# Patient Record
Sex: Male | Born: 1980 | Race: Black or African American | Hispanic: No | Marital: Single | Smoking: Former smoker
Health system: Southern US, Community
[De-identification: ages and names within clinical notes are randomized; demographics above are authoritative.]

## PROBLEM LIST (undated history)

## (undated) DIAGNOSIS — I10 Essential (primary) hypertension: Secondary | ICD-10-CM

## (undated) DIAGNOSIS — E785 Hyperlipidemia, unspecified: Secondary | ICD-10-CM

## (undated) DIAGNOSIS — I309 Acute pericarditis, unspecified: Secondary | ICD-10-CM

## (undated) DIAGNOSIS — M549 Dorsalgia, unspecified: Secondary | ICD-10-CM

---

## 2013-08-01 ENCOUNTER — Emergency Department (HOSPITAL_COMMUNITY): Payer: Self-pay

## 2013-08-01 ENCOUNTER — Encounter (HOSPITAL_COMMUNITY): Payer: Self-pay | Admitting: Emergency Medicine

## 2013-08-01 DIAGNOSIS — Z792 Long term (current) use of antibiotics: Secondary | ICD-10-CM | POA: Insufficient documentation

## 2013-08-01 DIAGNOSIS — I10 Essential (primary) hypertension: Secondary | ICD-10-CM | POA: Insufficient documentation

## 2013-08-01 DIAGNOSIS — R0789 Other chest pain: Secondary | ICD-10-CM | POA: Insufficient documentation

## 2013-08-01 DIAGNOSIS — Z79899 Other long term (current) drug therapy: Secondary | ICD-10-CM | POA: Insufficient documentation

## 2013-08-01 LAB — CBC
HEMATOCRIT: 38.4 % — AB (ref 39.0–52.0)
HEMOGLOBIN: 13.3 g/dL (ref 13.0–17.0)
MCH: 30.7 pg (ref 26.0–34.0)
MCHC: 34.6 g/dL (ref 30.0–36.0)
MCV: 88.7 fL (ref 78.0–100.0)
Platelets: 335 10*3/uL (ref 150–400)
RBC: 4.33 MIL/uL (ref 4.22–5.81)
RDW: 11.7 % (ref 11.5–15.5)
WBC: 11.9 10*3/uL — AB (ref 4.0–10.5)

## 2013-08-01 NOTE — ED Notes (Signed)
Pt. reports intermittent right chest pain for 5 days , denies SOB , no nausea or diaphoresis .

## 2013-08-02 ENCOUNTER — Emergency Department (HOSPITAL_COMMUNITY)
Admission: EM | Admit: 2013-08-02 | Discharge: 2013-08-02 | Disposition: A | Discharge status: Home / Self Care | Payer: Self-pay | Attending: Emergency Medicine | Admitting: Emergency Medicine

## 2013-08-02 DIAGNOSIS — I10 Essential (primary) hypertension: Secondary | ICD-10-CM

## 2013-08-02 DIAGNOSIS — R0789 Other chest pain: Secondary | ICD-10-CM

## 2013-08-02 LAB — BASIC METABOLIC PANEL
BUN: 17 mg/dL (ref 6–23)
CO2: 27 mEq/L (ref 19–32)
Calcium: 10.5 mg/dL (ref 8.4–10.5)
Chloride: 100 mEq/L (ref 96–112)
Creatinine, Ser: 1.18 mg/dL (ref 0.50–1.35)
GFR calc non Af Amer: 80 mL/min — ABNORMAL LOW (ref >90)
Glucose, Bld: 95 mg/dL (ref 70–99)
POTASSIUM: 3.6 meq/L — AB (ref 3.7–5.3)
Sodium: 140 mEq/L (ref 137–147)

## 2013-08-02 LAB — D-DIMER, QUANTITATIVE (NOT AT ARMC)

## 2013-08-02 LAB — TROPONIN I: Troponin I: <0.3 ng/mL (ref <0.30)

## 2013-08-02 MED ORDER — AMLODIPINE BESYLATE 5 MG PO TABS
5.0000 mg | ORAL_TABLET | Freq: Once | ORAL | Status: AC
  Administered 2013-08-02: 5 mg via ORAL
  Filled 2013-08-02: qty 1

## 2013-08-02 MED ORDER — AMLODIPINE BESYLATE 5 MG PO TABS: 5.0000 mg | ORAL_TABLET | Freq: Every day | ORAL | Status: DC

## 2013-08-02 NOTE — ED Provider Notes (Signed)
CSN: [id number]     Arrival date & time 08/01/13  [id number]   First MD Initiated Contact with Patient 08/02/13 0355     Chief Complaint  Patient presents with  . Chest Pain     (Consider location/radiation/quality/duration/timing/severity/associated sxs/prior Treatment) HPI 33 yo male presents to the ER from home with complaint of chest pain.  Pain is located in right chest, just under his breast.  Pain was sharp stabbing pain 5 days ago, followed by substernal pressure x 1 day, and since that time has been a ill defined discomfort or fullness.  Pt was seen at outside hospital and dx with sinusitis, placed on amox and ultram.  Pt reports h/o htn, had been on enalapril but stopped due to ED issues.  Family history of HTN, no h/o of CAD.  No prolonged immobilization, no leg swelling or pain, no recent surgeries, no h/o PE/DVT.  No other associated sxs with the pain.    History reviewed. No pertinent past medical history. History reviewed. No pertinent past surgical history. No family history on file. History  Substance Use Topics  . Smoking status: Never Smoker   . Smokeless tobacco: Not on file  . Alcohol Use: Yes    Review of Systems  All other systems reviewed and are negative.     Allergies  Review of patient's allergies indicates no known allergies.  Home Medications   Prior to Admission medications   Medication Sig Start Date End Date Taking? Authorizing Provider  amoxicillin (AMOXIL) 500 MG capsule Take 500 mg by mouth 3 (three) times daily. X 10days   Yes Historical Provider, MD  calcium carbonate (TUMS - DOSED IN MG ELEMENTAL CALCIUM) 500 MG chewable tablet Chew 2 tablets by mouth daily as needed for indigestion or heartburn.   Yes Historical Provider, MD  ibuprofen (ADVIL,MOTRIN) 800 MG tablet Take 800 mg by mouth every 8 (eight) hours as needed for mild pain.   Yes Historical Provider, MD  naproxen sodium (ANAPROX) 220 MG tablet Take 440 mg by mouth daily as needed  (headache).   Yes Historical Provider, MD  Phenylephrine-Pheniramine-DM [facility](THERAFLU COLD & COUGH PO) Take 1 packet by mouth daily as needed.   Yes Historical Provider, MD  traMADol (ULTRAM) 50 MG tablet Take 50 mg by mouth every 6 (six) hours as needed for moderate pain.   Yes Historical Provider, MD  amLODipine (NORVASC) 5 MG tablet Take 1 tablet (5 mg total) by mouth daily. 08/02/13   Olivia Mackielga M Latash Nouri, MD   BP 120/91  Pulse 70  Temp(Src) 98.5 F (36.9 C) (Oral)  Resp 22  Ht 6\' 2"  (1.88 m)  Wt 215 lb (97.523 kg)  BMI 27.59 kg/m2  SpO2 100% Physical Exam  Nursing note and vitals reviewed. Constitutional: He is oriented to person, place, and time. He appears well-developed and well-nourished.  HENT:  Head: Normocephalic and atraumatic.  Right Ear: External ear normal.  Left Ear: External ear normal.  Nose: Nose normal.  Mouth/Throat: Oropharynx is clear and moist.  Eyes: Conjunctivae and EOM are normal. Pupils are equal, round, and reactive to light.  Neck: Normal range of motion. Neck supple. No JVD present. No tracheal deviation present. No thyromegaly present.  Cardiovascular: Normal rate, regular rhythm, normal heart sounds and intact distal pulses.  Exam reveals no gallop and no friction rub.   No murmur heard. Pulmonary/Chest: Effort normal and breath sounds normal. No stridor. No respiratory distress. He has no wheezes. He has no rales. He exhibits  no tenderness.  Abdominal: Soft. Bowel sounds are normal. He exhibits no distension and no mass. There is no tenderness. There is no rebound and no guarding.  Musculoskeletal: Normal range of motion. He exhibits no edema and no tenderness.  Lymphadenopathy:    He has no cervical adenopathy.  Neurological: He is alert and oriented to person, place, and time. He has normal reflexes. No cranial nerve deficit. He exhibits normal muscle tone. Coordination normal.  Skin: Skin is warm and dry. No rash noted. No erythema. No pallor.  Psychiatric:  He has a normal mood and affect. His behavior is normal. Judgment and thought content normal.    ED Course  Procedures (including critical care time) Labs Review Labs Reviewed  BASIC METABOLIC PANEL - Abnormal; Notable for the following:    Potassium 3.6 (*)    GFR calc non Af Amer 80 (*)    All other components within normal limits  CBC - Abnormal; Notable for the following:    WBC 11.9 (*)    HCT 38.4 (*)    All other components within normal limits  TROPONIN I  D-DIMER, QUANTITATIVE    Imaging Review Dg Chest 2 View  08/02/2013   CLINICAL DATA:  Right chest pain.  Shortness of breath.  EXAM: CHEST  2 VIEW  COMPARISON:  None.  FINDINGS: Upper normal heart size. Lungs appear clear. No pleural effusion. No significant osseous abnormality observed.  IMPRESSION: No active cardiopulmonary disease.   Electronically Signed   By: Herbie BaltimoreWalt  Liebkemann M.D.   On: 08/02/2013 00:49     EKG Interpretation   Date/Time:  Thursday August 01 2013 23:38:14 EDT Ventricular Rate:  59 PR Interval:  176 QRS Duration: 104 QT Interval:  386 QTC Calculation: 382 R Axis:   77 Text Interpretation:  Sinus bradycardia with sinus arrhythmia Possible  Left atrial enlargement Left ventricular hypertrophy Abnormal ECG No old  tracing to compare Confirmed by Lavone Barrientes  MD, Hridaan Bouse ([id number]) on 08/02/2013  3:56:31 AM      MDM   Final diagnoses:  Essential hypertension  Atypical chest pain    33 yo male with 5 days of atypical chest pain.  Labs, cxr normal.  EKG with LVH noted, HTN here in ED.  Pt wishing to f/u with pcm, will start on norvasc for bp control.      Olivia Mackielga M Esley Brooking, MD 08/02/13 [phone]

## 2013-08-02 NOTE — ED Notes (Signed)
Pt concerned about his blood pressure.

## 2013-08-02 NOTE — Discharge Instructions (Signed)
Chest Pain (Nonspecific) It is often hard to give a diagnosis for the cause of chest pain. There is always a chance that your pain could be related to something serious, such as a heart attack or a blood clot in the lungs. You need to follow up with your doctor. HOME CARE  If antibiotic medicine was given, take it as directed by your doctor. Finish the medicine even if you start to feel better.  For the next few days, avoid activities that bring on chest pain. Continue physical activities as told by your doctor.  Do not use any tobacco products. This includes cigarettes, chewing tobacco, and e-cigarettes.  Avoid drinking alcohol.  Only take medicine as told by your doctor.  Follow your doctor's suggestions for more testing if your chest pain does not go away.  Keep all doctor visits you made. GET HELP IF:  Your chest pain does not go away, even after treatment.  You have a rash with blisters on your chest.  You have a fever. GET HELP RIGHT AWAY IF:   You have more pain or pain that spreads to your arm, neck, jaw, back, or belly (abdomen).  You have shortness of breath.  You cough more than usual or cough up blood.  You have very bad back or belly pain.  You feel sick to your stomach (nauseous) or throw up (vomit).  You have very bad weakness.  You pass out (faint).  You have chills. This is an emergency. Do not wait to see if the problems will go away. Call your local emergency services (911 in U.S.). Do not drive yourself to the hospital. MAKE SURE YOU:   Understand these instructions.  Will watch your condition.  Will get help right away if you are not doing well or get worse. Document Released: 07/13/2007 Document Revised: 01/29/2013 Document Reviewed: 07/13/2007 [facility] Patient Information 2015 [place], [place]. This information is not intended to replace advice given to you by your health care provider. Make sure you discuss any questions you have with your  health care provider.  DASH Eating Plan DASH stands for "Dietary Approaches to Stop Hypertension." The DASH eating plan is a healthy eating plan that has been shown to reduce high blood pressure (hypertension). Additional health benefits may include reducing the risk of type 2 diabetes mellitus, heart disease, and stroke. The DASH eating plan may also help with weight loss. WHAT DO I NEED TO KNOW ABOUT THE DASH EATING PLAN? For the DASH eating plan, you will follow these general guidelines:  Choose foods with a percent daily value for sodium of less than 5% (as listed on the food label).  Use salt-free seasonings or herbs instead of table salt or sea salt.  Check with your health care provider or pharmacist before using salt substitutes.  Eat lower-sodium products, often labeled as "lower sodium" or "no salt added."  Eat fresh foods.  Eat more vegetables, fruits, and low-fat dairy products.  Choose whole grains. Look for the word "whole" as the first word in the ingredient list.  Choose fish and skinless chicken or [place] more often than red meat. Limit fish, poultry, and meat to 6 oz (170 g) each day.  Limit sweets, desserts, sugars, and sugary drinks.  Choose heart-healthy fats.  Limit cheese to 1 oz (28 g) per day.  Eat more home-cooked food and less restaurant, buffet, and fast food.  Limit fried foods.  Cook foods using methods other than frying.  Limit canned vegetables. If  you do use them, rinse them well to decrease the sodium.  When eating at a restaurant, ask that your food be prepared with less salt, or no salt if possible. WHAT FOODS CAN I EAT? Seek help from a dietitian for individual calorie needs. Grains Whole grain or whole wheat bread. Brown rice. Whole grain or whole wheat pasta. Quinoa, bulgur, and whole grain cereals. Low-sodium cereals. Corn or whole wheat flour tortillas. Whole grain cornbread. Whole grain crackers. Low-sodium  crackers. Vegetables Fresh or frozen vegetables (raw, steamed, roasted, or grilled). Low-sodium or reduced-sodium tomato and vegetable juices. Low-sodium or reduced-sodium tomato sauce and paste. Low-sodium or reduced-sodium canned vegetables.  Fruits All fresh, canned (in natural juice), or frozen fruits. Meat and Other Protein Products Ground beef (85% or leaner), grass-fed beef, or beef trimmed of fat. Skinless chicken or [place]. Ground chicken or [place]. Pork trimmed of fat. All fish and seafood. Eggs. Dried beans, peas, or lentils. Unsalted nuts and seeds. Unsalted canned beans. Dairy Low-fat dairy products, such as skim or 1% milk, 2% or reduced-fat cheeses, low-fat ricotta or cottage cheese, or plain low-fat yogurt. Low-sodium or reduced-sodium cheeses. Fats and Oils Tub margarines without trans fats. Light or reduced-fat mayonnaise and salad dressings (reduced sodium). Avocado. Safflower, olive, or canola oils. Natural peanut or almond butter. Other Unsalted popcorn and pretzels. The items listed above may not be a complete list of recommended foods or beverages. Contact your dietitian for more options. WHAT FOODS ARE NOT RECOMMENDED? Grains White bread. White pasta. White rice. Refined cornbread. Bagels and croissants. Crackers that contain trans fat. Vegetables Creamed or fried vegetables. Vegetables in a cheese sauce. Regular canned vegetables. Regular canned tomato sauce and paste. Regular tomato and vegetable juices. Fruits Dried fruits. Canned fruit in light or heavy syrup. Fruit juice. Meat and Other Protein Products Fatty cuts of meat. Ribs, chicken wings, bacon, sausage, bologna, salami, chitterlings, fatback, hot dogs, bratwurst, and packaged luncheon meats. Salted nuts and seeds. Canned beans with salt. Dairy Whole or 2% milk, cream, half-and-half, and cream cheese. Whole-fat or sweetened yogurt. Full-fat cheeses or blue cheese. Nondairy creamers and whipped toppings.  Processed cheese, cheese spreads, or cheese curds. Condiments Onion and garlic salt, seasoned salt, table salt, and sea salt. Canned and packaged gravies. Worcestershire sauce. Tartar sauce. Barbecue sauce. Teriyaki sauce. Soy sauce, including reduced sodium. Steak sauce. Fish sauce. Oyster sauce. Cocktail sauce. Horseradish. Ketchup and mustard. Meat flavorings and tenderizers. Bouillon cubes. Hot sauce. Tabasco sauce. Marinades. Taco seasonings. Relishes. Fats and Oils Butter, stick margarine, lard, shortening, ghee, and bacon fat. Coconut, palm kernel, or palm oils. Regular salad dressings. Other Pickles and olives. Salted popcorn and pretzels. The items listed above may not be a complete list of foods and beverages to avoid. Contact your dietitian for more information. WHERE CAN I FIND MORE INFORMATION? National Heart, Lung, and Blood Institute: [email or web address] Document Released: 01/13/2011 Document Revised: 01/29/2013 Document Reviewed: 11/28/2012 [facility] Patient Information 2015 [place], [place]. This information is not intended to replace advice given to you by your health care provider. Make sure you discuss any questions you have with your health care provider.  Hypertension Hypertension, commonly called high blood pressure, is when the force of blood pumping through your arteries is too strong. Your arteries are the blood vessels that carry blood from your heart throughout your body. A blood pressure reading consists of a higher number over a lower number, such as 110/72. The higher number (systolic) is the pressure inside your arteries when your  heart pumps. The lower number (diastolic) is the pressure inside your arteries when your heart relaxes. Ideally you want your blood pressure below 120/80. Hypertension forces your heart to work harder to pump blood. Your arteries may become narrow or stiff. Having hypertension puts you at risk for heart disease,  stroke, and other problems.  RISK FACTORS Some risk factors for high blood pressure are controllable. Others are not.  Risk factors you cannot control include:   Race. You may be at higher risk if you are African American.  Age. Risk increases with age.  Gender. Men are at higher risk than women before age 34 years. After age 765, women are at higher risk than men. Risk factors you can control include:  Not getting enough exercise or physical activity.  Being overweight.  Getting too much fat, sugar, calories, or salt in your diet.  Drinking too much alcohol. SIGNS AND SYMPTOMS Hypertension does not usually cause signs or symptoms. Extremely high blood pressure (hypertensive crisis) may cause headache, anxiety, shortness of breath, and nosebleed. DIAGNOSIS  To check if you have hypertension, your health care provider will measure your blood pressure while you are seated, with your arm held at the level of your heart. It should be measured at least twice using the same arm. Certain conditions can cause a difference in blood pressure between your right and left arms. A blood pressure reading that is higher than normal on one occasion does not mean that you need treatment. If one blood pressure reading is high, ask your health care provider about having it checked again. TREATMENT  Treating high blood pressure includes making lifestyle changes and possibly taking medication. Living a healthy lifestyle can help lower high blood pressure. You may need to change some of your habits. Lifestyle changes may include:  Following the DASH diet. This diet is high in fruits, vegetables, and whole grains. It is low in salt, red meat, and added sugars.  Getting at least 2 1/2 hours of brisk physical activity every week.  Losing weight if necessary.  Not smoking.  Limiting alcoholic beverages.  Learning ways to reduce stress. If lifestyle changes are not enough to get your blood pressure under  control, your health care provider may prescribe medicine. You may need to take more than one. Work closely with your health care provider to understand the risks and benefits. HOME CARE INSTRUCTIONS  Have your blood pressure rechecked as directed by your health care provider.   Only take medicine as directed by your health care provider. Follow the directions carefully. Blood pressure medicines must be taken as prescribed. The medicine does not work as well when you skip doses. Skipping doses also puts you at risk for problems.   Do not smoke.   Monitor your blood pressure at home as directed by your health care provider. SEEK MEDICAL CARE IF:   You think you are having a reaction to medicines taken.  You have recurrent headaches or feel dizzy.  You have swelling in your ankles.  You have trouble with your vision. SEEK IMMEDIATE MEDICAL CARE IF:  You develop a severe headache or confusion.  You have unusual weakness, numbness, or feel faint.  You have severe chest or abdominal pain.  You vomit repeatedly.  You have trouble breathing. MAKE SURE YOU:   Understand these instructions.  Will watch your condition.  Will get help right away if you are not doing well or get worse. Document Released: 01/24/2005 Document Revised: 01/29/2013 Document  Reviewed: 11/16/2012 ExitCare Patient Information 2015 [place], [place]. This information is not intended to replace advice given to you by your health care provider. Make sure you discuss any questions you have with your health care provider.   Emergency Department Resource Guide 1) Find a Doctor and Pay Out of Pocket Although you won't have to find out who is covered by your insurance plan, it is a good idea to ask around and get recommendations. You will then need to call the office and see if the doctor you have chosen will accept you as a new patient and what types of options they offer for patients who are self-pay. Some doctors  offer discounts or will set up payment plans for their patients who do not have insurance, but you will need to ask so you aren't surprised when you get to your appointment.  2) Contact Your Local Health Department Not all health departments have doctors that can see patients for sick visits, but many do, so it is worth a call to see if yours does. If you don't know where your local health department is, you can check in your phone book. The CDC also has a tool to help you locate your state's health department, and many state websites also have listings of all of their local health departments.  3) Find a Walk-in Clinic If your illness is not likely to be very severe or complicated, you may want to try a walk in clinic. These are popping up all over the country in pharmacies, drugstores, and shopping centers. They're usually staffed by nurse practitioners or physician assistants that have been trained to treat common illnesses and complaints. They're usually fairly quick and inexpensive. However, if you have serious medical issues or chronic medical problems, these are probably not your best option.  No Primary Care Doctor: - Call Health Connect at  [phone] - they can help you locate a primary care doctor that  accepts your insurance, provides certain services, etc. - Physician Referral Service- [phone]  Chronic Pain Problems: Organization         Address  Phone   Notes  [facility]  [phone] Patients need to be referred by their primary care doctor.   Medication Assistance: Organization         Address  Phone   Notes  [facility] [address] [place], [place] [address] [phone] --Must be a resident of [place] -- Must have NO insurance coverage whatsoever (no Medicaid/ Medicare, etc.) -- The pt. MUST have a primary care doctor that directs their care regularly and follows them in the community    MedAssist  [phone]   [employer]  [phone]    Agencies that provide inexpensive medical care: Organization         Address  Phone   Notes  [facility]  [phone]   [facility]    [phone]   [facility] [address] [place], [place] [address] [phone]   [facility] [address], [place] [phone]   [facility]anned Parenthood    [phone]   [facility]    [phone]   [facility]  [address], [place] Phone:  [phone], Fax:  [phone] Hours of Operation:  9 am - 6 pm, M-F.  Also accepts Medicaid/Medicare and self-pay.  [facility]  [address], [place] Phone: [phone], Fax: [phone]. Hours of Operation:  8:30 am - 5:30 pm, M-F.  Also accepts Medicaid and self-pay.  [facility] [place] [address] [place] Phone: [phone]   [facility] [address] Natasha Bence [place], [place] [phone], Ext. [phone] Mondays & Thursdays: 7-9 AM.  First 15 patients are seen on a first come, first serve basis.    Medicaid-accepting [facility] Providers:  Organization         Address  Phone   Notes  [facility] [address],  [phone] Also accepts self-pay patients.  [facility] [address] Laurell Josephs [place], [place]  [phone]   [facility] [address], [place] [phone]   [facility] [address] [place] [phone]   Renaye Rakers [address], [place]   [phone] Only accepts [place] Access [place] patients after they have their name applied to their card.   Self-Pay (no insurance) in [facility]:  Organization         Address  Phone   Notes  Sickle Cell Patients, [facility]  Medicine [address] [place], [place] [phone]   [facility] [address] [place], [place] [phone]   [name] [place]  [address], [place] [phone]   [facility]/Dr. Osei-Bonsu  [address], [place] or [address], [place] [phone] Phone number for both [place] and [place] locations is the same.  [facility] [address], [place] [phone]   [facility] [address], [place] or [address] [phone]   [facility] [address] [place] [phone], phone; [phone], fax Sees patients 1st and 3rd Saturday of every month.  Must not qualify for public or private insurance (i.e. Medicaid, Medicare, [place] Health Choice, Veterans' Benefits)  Household income should be no more than 200% of the poverty level The clinic cannot treat you if you are pregnant or think you are pregnant  Sexually transmitted diseases are not treated at the clinic.    Dental Care: Organization         Address  Phone  Notes  [facility] Department of [facility] [address] [place], [place] [phone] Accepts children up to age 47 who are enrolled in [place] or [place] Health Choice; pregnant women with a Medicaid card; and children who have applied for Medicaid or [place] Health Choice, but were declined, whose parents can pay a reduced fee at time of service.  [facility] Department of [facility]  [address], [place] [phone] Accepts children up to age 32 who are enrolled in [place] or [place] Health Choice; pregnant women with a Medicaid card; and children who have applied for Medicaid or [place] Health Choice, but were declined, whose parents can pay a reduced fee at time of service.  [facility]  [address]  [place], [place] [phone] Patients are seen by appointment only. Walk-ins are not accepted. [facility] will see patients 34 years of age and older. Monday - Tuesday (8am-5pm) Most Wednesdays (8:30-5pm) $30 per  visit, cash only  [facility] Adult [employer] PROGRAM  [address], [facility] [phone] Patients are seen by appointment only. Walk-ins are not accepted. [facility] will see patients 68 years of age and older. One Wednesday Evening (Monthly: Volunteer Based).  $30 per visit, cash only  [employer]  [phone] for adults; Children under age 35, call Graduate Pediatric Dentistry at [phone]. Children aged 22-14, please call [phone] to request a pediatric application.  Dental services are provided in all areas of dental care including fillings, crowns and bridges, complete and partial dentures, implants, gum treatment, root canals, and extractions. Preventive care is also provided. Treatment is provided to both adults and children. Patients are selected via a lottery and there is often a waiting list.   [facility] [address], [place]  [phone] [email or web address]   [facility] [address] [place], [place] [phone], Ext. [phone] Second and Fourth Thursday of each month, opens at 6:30 AM; Clinic ends at 9 AM.  Patients are seen on a first-come first-served basis, and a limited number are seen during each clinic.   [facility]  [address] Ether Griffins [place], [place] [phone]   Eligibility Requirements You must have lived in [place], [place], or [place] counties for at least the last three months.   You cannot be eligible for state or federal sponsored [place], including [employer], [place], or [employer].   You generally cannot be eligible for healthcare insurance through your employer.    How to apply: Eligibility screenings are held every Tuesday and  Wednesday afternoon from 1:00 pm until 4:00 pm. You do not need an appointment for the interview!  [facility] [address], [place], [place] [phone]   [facility] Health Department  [phone]   [facility] Health Department  [phone]   [facility] Health Department  [phone]    Behavioral Health Resources in the Community: Intensive Outpatient Programs Organization         Address  Phone  Notes  [facility] [address], [place], [place] [phone]   [facility] Outpatient [address] [place], [place] [phone]   [facility] [address] [place], [place]  [phone]   [facility] Mental Health [address],  [place], [place] [phone] or [phone]   Substance Abuse Resources Organization         Address  Phone  Notes  [facility]  [phone]   [facility]  [phone]   [facility]  [phone]   Floydene Flock  [phone]   Residential & Outpatient Substance Abuse Program  [phone]   Psychological Services Organization         Address  Phone  Notes  [facility]  [phone]   [facility] Services  [phone]   [facility] Mental Health [address], [place] [phone] or [phone]    Mobile Crisis Teams Organization         Address  Phone  Notes  [facility], Mobile Crisis Care Unit  [phone]   [facility]  [address] [place], [place] [phone]   Doristine Locks [address] [place] [phone]    Self-Help/Support Groups Organization         Address  Phone  Notes  [facility] [place] - variety of support groups  [phone]- [phone] Call for more information  Narcotics Anonymous (NA), [facility] [address], [employer] [place]  2 meetings at this location   Risk manager         Address  Phone  Notes  [facility] [address],    [place]  [phone]   [facility]  [address] [place] [address], [place], [place] [phone]   [facility] [address] [place], [place] [phone] Admissions: 8am-3pm M-F  [facility] [address],    [place], [place] [phone]   [facility] [address] [place], [place], [place] [phone]   [facility] [address]  [place], [place] [phone]   [facility] - Intensive Outpatient [address] Laurell Josephs [address], [place], [place] [phone]   [facility] ([facility] [address]  [place], [place] [phone] or [phone]   [facility] ([facility]) [address] [place], [place] [phone] Accepts Medicaid  [facility] [address],  [place] [phone] Substance Abuse/Addiction Treatment   [facility] Organization         Address  Phone  Notes  [facility]  [phone]   Angie Fava, PhD [address] Ervin Knack [place], [place]   [phone] or [phone]   [facility] Behavioral   [address] [place], [place] [phone]   [facility] [address] [place], [place] [phone] Insurance/Medicaid/sponsorship through [facility] and Families [address], [address] [address]                                    [place], [place] [phone] Therapy/tele-psych/case  [facility] [address], [place] [phone]    Dr. Lolly Mustache  [phone]   [facility]  [employer] [facility] 1) [address] [place] 2) [address] [place] 3)  [address], [place] [phone]  [phone]   [facility] Child Abuse Hotline [phone] or [phone] (After Hours)

## 2013-08-02 NOTE — ED Notes (Signed)
Discharge instructions reviewed with pt. Pt verbalized understanding.   

## 2013-08-05 ENCOUNTER — Ambulatory Visit: Payer: Self-pay

## 2013-08-06 ENCOUNTER — Ambulatory Visit: Payer: Self-pay | Admitting: Internal Medicine

## 2013-08-13 ENCOUNTER — Emergency Department (HOSPITAL_COMMUNITY)
Admission: EM | Admit: 2013-08-13 | Discharge: 2013-08-13 | Disposition: A | Discharge status: Home / Self Care | Payer: Self-pay | Attending: Emergency Medicine | Admitting: Emergency Medicine

## 2013-08-13 ENCOUNTER — Encounter (HOSPITAL_COMMUNITY): Payer: Self-pay | Admitting: Emergency Medicine

## 2013-08-13 DIAGNOSIS — M5416 Radiculopathy, lumbar region: Secondary | ICD-10-CM

## 2013-08-13 DIAGNOSIS — Z792 Long term (current) use of antibiotics: Secondary | ICD-10-CM | POA: Insufficient documentation

## 2013-08-13 DIAGNOSIS — I10 Essential (primary) hypertension: Secondary | ICD-10-CM | POA: Insufficient documentation

## 2013-08-13 DIAGNOSIS — IMO0002 Reserved for concepts with insufficient information to code with codable children: Secondary | ICD-10-CM | POA: Insufficient documentation

## 2013-08-13 DIAGNOSIS — Z79899 Other long term (current) drug therapy: Secondary | ICD-10-CM | POA: Insufficient documentation

## 2013-08-13 HISTORY — DX: Essential (primary) hypertension: I10

## 2013-08-13 HISTORY — DX: Dorsalgia, unspecified: M54.9

## 2013-08-13 MED ORDER — CYCLOBENZAPRINE HCL 10 MG PO TABS: 10.0000 mg | ORAL_TABLET | Freq: Two times a day (BID) | ORAL | Status: DC | PRN

## 2013-08-13 MED ORDER — HYDROCODONE-ACETAMINOPHEN 5-325 MG PO TABS
1.0000 | ORAL_TABLET | Freq: Once | ORAL | Status: AC
  Administered 2013-08-13: 1 via ORAL
  Filled 2013-08-13: qty 1

## 2013-08-13 MED ORDER — HYDROCODONE-ACETAMINOPHEN 5-325 MG PO TABS: ORAL_TABLET | ORAL | Status: DC

## 2013-08-13 MED ORDER — CYCLOBENZAPRINE HCL 10 MG PO TABS
10.0000 mg | ORAL_TABLET | Freq: Once | ORAL | Status: AC
  Administered 2013-08-13: 10 mg via ORAL
  Filled 2013-08-13: qty 1

## 2013-08-13 NOTE — ED Notes (Signed)
Present with lower back pain worse when lying down to sleep, sneezing and coughing. Lower back pain radiates into left leg sometimnes right but mostly left. Pt is able to ambulate with limp, standing makes pain worse. Tramadol is not helping with pain. Denies loss of B&B.  Back pain began in November and is intermittent. This episode is worse. Reports DX of a herniated disc.
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
## 2013-08-13 NOTE — Discharge Instructions (Signed)
For pain control please take ibuprofen (also known as Motrin or Advil) 800mg  (this is normally 4 over the counter pills) 3 times a day  for 5 days. Take with food to minimize stomach irritation.  Take vicodin and/or Flexeril for breakthrough pain, do not drink alcohol, drive, care for children or do other critical tasks while taking vicodin and/or Flexeril.  Do not hesitate to return to the Emergency Department for any new, worsening or concerning symptoms.   If you do not have a primary care doctor you can establish one at the   [facility]: [address] [place] [id number] [phone]  After you establish care. Let them know you were seen in the emergency room. They must obtain records for further management.    Lumbosacral Radiculopathy Lumbosacral radiculopathy is a pinched nerve or nerves in the low back (lumbosacral area). When this happens you may have weakness in your legs and may not be able to stand on your toes. You may have pain going down into your legs. There may be difficulties with walking normally. There are many causes of this problem. Sometimes this may happen from an injury, or simply from arthritis or boney problems. It may also be caused by other illnesses such as diabetes. If there is no improvement after treatment, further studies may be done to find the exact cause. DIAGNOSIS  X-rays may be needed if the problems become long standing. Electromyograms may be done. This study is one in which the working of nerves and muscles is studied. HOME CARE INSTRUCTIONS   Applications of ice packs may be helpful. Ice can be used in a plastic bag with a towel around it to prevent frostbite to skin. This may be used every 2 hours for 20 to 30 minutes, or as needed, while awake, or as directed by your caregiver.  Only take over-the-counter or prescription medicines for pain, discomfort, or fever as directed by your caregiver.  If physical therapy was  prescribed, follow your caregiver's directions. SEEK IMMEDIATE MEDICAL CARE IF:   You have pain not controlled with medications.  You seem to be getting worse rather than better.  You develop increasing weakness in your legs.  You develop loss of bowel or bladder control.  You have difficulty with walking or balance, or develop clumsiness in the use of your legs.  You have a fever. MAKE SURE YOU:   Understand these instructions.  Will watch your condition.  Will get help right away if you are not doing well or get worse. Document Released: 01/24/2005 Document Revised: 04/18/2011 Document Reviewed: 09/14/2007 [facility] Patient Information 2015 [employer] [place]. This information is not intended to replace advice given to you by your health care provider. Make sure you discuss any questions you have with your health care provider.

## 2013-08-14 NOTE — ED Provider Notes (Signed)
Medical screening examination/treatment/procedure(s) were performed by non-physician practitioner and as supervising physician I was immediately available for consultation/collaboration.   EKG Interpretation None        Yasmyn Bellisario, MD 08/14/13 0044 

## 2013-09-01 ENCOUNTER — Emergency Department (HOSPITAL_COMMUNITY)
Admission: EM | Admit: 2013-09-01 | Discharge: 2013-09-01 | Disposition: A | Discharge status: Home / Self Care | Payer: Self-pay | Attending: Emergency Medicine | Admitting: Emergency Medicine

## 2013-09-01 ENCOUNTER — Encounter (HOSPITAL_COMMUNITY): Payer: Self-pay | Admitting: Emergency Medicine

## 2013-09-01 DIAGNOSIS — I10 Essential (primary) hypertension: Secondary | ICD-10-CM | POA: Insufficient documentation

## 2013-09-01 DIAGNOSIS — G8929 Other chronic pain: Secondary | ICD-10-CM | POA: Insufficient documentation

## 2013-09-01 DIAGNOSIS — Z79899 Other long term (current) drug therapy: Secondary | ICD-10-CM | POA: Insufficient documentation

## 2013-09-01 DIAGNOSIS — M5430 Sciatica, unspecified side: Secondary | ICD-10-CM | POA: Insufficient documentation

## 2013-09-01 DIAGNOSIS — Z760 Encounter for issue of repeat prescription: Secondary | ICD-10-CM | POA: Insufficient documentation

## 2013-09-01 DIAGNOSIS — M5442 Lumbago with sciatica, left side: Secondary | ICD-10-CM

## 2013-09-01 MED ORDER — AMLODIPINE BESYLATE 5 MG PO TABS: 5.0000 mg | ORAL_TABLET | Freq: Every day | ORAL | Status: DC

## 2013-09-01 MED ORDER — HYDROCODONE-ACETAMINOPHEN 5-325 MG PO TABS: 1.0000 | ORAL_TABLET | Freq: Four times a day (QID) | ORAL | Status: DC | PRN

## 2013-09-01 MED ORDER — CYCLOBENZAPRINE HCL 10 MG PO TABS: 10.0000 mg | ORAL_TABLET | Freq: Two times a day (BID) | ORAL | Status: DC | PRN

## 2013-09-01 NOTE — Discharge Instructions (Signed)
Medication Refill, Emergency Department We have refilled your medication today as a courtesy to you. It is best for your medical care, however, to take care of getting refills done through your primary caregiver's office. They have your records and can do a better job of follow-up than we can in the emergency department. On maintenance medications, we often only prescribe enough medications to get you by until you are able to see your regular caregiver. This is a more expensive way to refill medications. In the future, please plan for refills so that you will not have to use the emergency department for this. Thank you for your help. Your help allows us to better take care of the daily emergencies that enter our department. Document Released: 05/13/2003 Document Revised: 04/18/2011 Document Reviewed: 05/03/2013 [facility] Patient Information 2015 [place]Care, [place]LL[place]. This information is not intended to replace advice given to you by your health care provider. Make sure you discuss any questions you have with your health care provider.  Back Pain, Adult Back pain is very common. The pain often gets better over time. The cause of back pain is usually not dangerous. Most people can learn to manage their back pain on their own.  HOME CARE   Stay active. Start with short walks on flat ground if you can. Try to walk farther each day.  Do not sit, drive, or stand in one place for more than 30 minutes. Do not stay in bed.  Do not avoid exercise or work. Activity can help your back heal faster.  Be careful when you bend or lift an object. Bend at your knees, keep the object close to you, and do not twist.  Sleep on a firm mattress. Lie on your side, and bend your knees. If you lie on your back, put a pillow under your knees.  Only take medicines as told by your doctor.  Put ice on the injured area.  Put ice in a plastic bag.  Place a towel between your skin and the bag.  Leave the ice on for 15-20  minutes, 03-04 times a day for the first 2 to 3 days. After that, you can switch between ice and heat packs.  Ask your doctor about back exercises or massage.  Avoid feeling anxious or stressed. Find good ways to deal with stress, such as exercise. GET HELP RIGHT AWAY IF:   Your pain does not go away with rest or medicine.  Your pain does not go away in 1 week.  You have new problems.  You do not feel well.  The pain spreads into your legs.  You cannot control when you poop (bowel movement) or pee (urinate).  Your arms or legs feel weak or lose feeling (numbness).  You feel sick to your stomach (nauseous) or throw up (vomit).  You have belly (abdominal) pain.  You feel like you may pass out (faint). MAKE SURE YOU:   Understand these instructions.  Will watch your condition.  Will get help right away if you are not doing well or get worse. Document Released: 07/13/2007 Document Revised: 04/18/2011 Document Reviewed: 05/28/2013 [facility] Patient Information 2015 [place]ExitCare, [place]LLC. This information is not intended to replace advice given to you by your health care provider. Make sure you discuss any questions you have with your health care provider.   Emergency Department Resource Guide 1) Find a Doctor and Pay Out of Pocket Although you won't have to find out who is covered by your insurance plan, it is  a good idea to ask around and get recommendations. You will then need to call the office and see if the doctor you have chosen will accept you as a new patient and what types of options they offer for patients who are self-pay. Some doctors offer discounts or will set up payment plans for their patients who do not have insurance, but you will need to ask so you aren't surprised when you get to your appointment.  2) Contact Your Local Health Department Not all health departments have doctors that can see patients for sick visits, but many do, so it is worth a call to see if yours  does. If you don't know where your local health department is, you can check in your phone book. The CDC also has a tool to help you locate your state's health department, and many state websites also have listings of all of their local health departments.  3) Find a Walk-in Clinic If your illness is not likely to be very severe or complicated, you may want to try a walk in clinic. These are popping up all over the country in pharmacies, drugstores, and shopping centers. They're usually staffed by nurse practitioners or physician assistants that have been trained to treat common illnesses and complaints. They're usually fairly quick and inexpensive. However, if you have serious medical issues or chronic medical problems, these are probably not your best option.  No Primary Care Doctor: - Call Health Connect at  [phone] - they can help you locate a primary care doctor that  accepts your insurance, provides certain services, etc. - Physician Referral Service- [phone]  Chronic Pain Problems: Organization         Address  Phone   Notes  [facility]  [phone] Patients need to be referred by their primary care doctor.   Medication Assistance: Organization         Address  Phone   Notes  [facility] Medication [facility] [address] [place] [place], [place] [address] [phone] --Must be a resident of [facility] -- Must have NO insurance coverage whatsoever (no Medicaid/ Medicare, etc.) -- The pt. MUST have a primary care doctor that directs their care regularly and follows them in the community   [facility]  [phone]   [employer]  [phone]    Agencies that provide inexpensive medical care: Organization         Address  Phone   Notes  [name]  [phone]   [name]    [phone]   [facility] [address] [place], [place] [address] [phone]    [facility] [address], [place] [phone]   Planned Parenthood    [phone]   [facility]    [phone]   [facility] and [facility]  [address], [place] Phone:  [phone], Fax:  [phone] Hours of Operation:  9 am - 6 pm, M-F.  Also accepts Medicaid/Medicare and self-pay.  [facility]  [address], [place] Phone: [phone], Fax: [phone]. Hours of Operation:  8:30 am - 5:30 pm, M-F.  Also accepts Medicaid and self-pay.  [facility] [place] [address], [place] Phone: [phone]   [facility] [address] [place] [address] Natasha Bence [place], [place] [phone], Ext. [phone] Mondays & Thursdays: 7-9 AM.  First 15 patients are seen on a first come, first serve basis.    Medicaid-accepting [facility] Providers:  Organization         Address  Phone   Notes  [facility] [address], [place] [phone] Also accepts self-pay patients.  [facility] [address] Laurell Josephs [place], [place]  [phone]   [facility] [address], [place] [phone]   [facility] [address], [place] [phone]   Renaye Rakers [address], [place]   [phone] Only accepts [place] Access [place] patients after they have their name applied to their card.   Self-Pay (no insurance) in [facility]:  Organization         Address  Phone   Notes  Sickle Cell Patients, [facility] [address] [place], [place] [phone]   [facility] [address] [place], [place] [phone]   [name] [place]  [address], [place] [phone]   [facility]/Dr. Osei-Bonsu  [address] [place] or [address], [place] [phone] Phone number for both [place] and [place] locations is the same.  [facility]gent [facility] and [facility] [address] [place] [phone]   [facility] [address] [place] or [address] [phone]   [facility] [address] [place] [phone], phone; [phone], fax Sees patients 1st and 3rd Saturday of every month.  Must not qualify for public or private insurance (i.e. Medicaid, Medicare, [place] Health Choice, Veterans' Benefits)  Household income should be no more than 200% of the poverty level The clinic cannot treat you if you are pregnant or think you are pregnant  Sexually transmitted diseases are not treated at the clinic.    Dental Care: Organization         Address  Phone  Notes  [facility] of [facility] [address] [place], [place] [phone] Accepts children up to age 76 who are enrolled in [place] or [place] Health Choice; pregnant women with a Medicaid card; and children who have applied for Medicaid or [place] Health Choice, but were declined, whose parents can pay a reduced fee at time of service.  [facility] Department of [facility]  [address], [place] [phone] Accepts children up to age 7 who are enrolled in [place] or [place] Health Choice; pregnant women with a Medicaid card; and children who have applied for Medicaid or [place] Health Choice, but were declined, whose parents can pay a reduced fee at time of service.  [facility]  [address] [place], [place] [phone] Patients are seen by appointment only. Walk-ins are not accepted. [facility] will see patients 68 years of age and older. Monday - Tuesday (8am-5pm) Most Wednesdays (8:30-5pm) $30 per visit, cash only  [facility] Adult [facility] Access [facility]  [address], [facility] [phone] Patients are seen by  appointment only. Walk-ins are not accepted. [facility] will see patients 57 years of age and older. One Wednesday Evening (Monthly: Volunteer Based).  $30 per visit, cash only  [employer]  [phone] for adults; Children under age 46, call Graduate  Pediatric Dentistry at [phone]. Children aged 64-14, please call [phone] to request a pediatric application.  Dental services are provided in all areas of dental care including fillings, crowns and bridges, complete and partial dentures, implants, gum treatment, root canals, and extractions. Preventive care is also provided. Treatment is provided to both adults and children. Patients are selected via a lottery and there is often a waiting list.   [facility] [address], [place]  [phone] [email or web address]   [facility] [address] [place], [place] [phone], Ext. [phone] Second and Fourth Thursday of each month, opens at 6:30 AM; Clinic ends at 9 AM.  Patients are seen on a first-come first-served basis, and a limited number are seen during each clinic.   [facility]  [address] Ether Griffins [place], [place] [phone]   Eligibility Requirements You must have lived in [place], [place], or [place] counties for at least the last three months.   You cannot be eligible for state or federal sponsored [place], including [employer], [place], or [employer].   You generally cannot be eligible for healthcare insurance through your employer.    How to apply: Eligibility screenings are held every Tuesday and Wednesday afternoon from 1:00 pm until 4:00 pm. You do not need an appointment for the interview!  [facility] [address] [place], [place] [phone]   [facility] Health Department  [phone]   [facility] Health Department  [phone]   [facility] Health Department  [phone]     Behavioral Health Resources in the Community: Intensive Outpatient Programs Organization         Address  Phone  Notes  [facility] [address] [place], [place] [phone]   [facility] Outpatient [address], [place], [place] [phone]   [facility] [address], [place], [place]  [phone]   [facility] [address],  [place], [place] [phone] or [phone]   Substance Abuse Resources Organization         Address  Phone  Notes  [facility]  [phone]   [facility]  [phone]   [facility] [place]  [phone]   Floydene Flock  [phone]   Residential & Outpatient Substance Abuse Program  [phone]   Psychological Services Organization         Address  Phone  Notes  [facility]  [phone]   [facility]  [phone]   [facility] [address] [place] [phone] or [phone]    Mobile Crisis Teams Organization         Address  Phone  Notes  [facility], Mobile Crisis Care Unit  [phone]   [facility]  [address] [place], [place] [phone]   Doristine Locks [address] [place] [phone]    Self-Help/Support Groups Organization         Address  Phone             Notes  [facility] [place] - variety of support groups  [phone]- I7437963 Call for more information  Narcotics Anonymous (NA), [facility] [address], [employer] [place]  2 meetings at this location   Statistician         Address  Phone  Notes  [facility] [address],  [place]  [place]  [address] [place] [address], [place], [place]   [place], [place] [phone] Admissions: 8am-3pm M-F  Incentives  Substance [place] [address]    [place], [place] [phone]   [facility] [address] [place] [address] [place], [place], [place]   [facility] [address],  [place], [place]   [facility] - Intensive Outpatient [address] Kristeen Mans [address], [place], [place]   [facility] ([place]  [place], [place] [phone] or [phone]   [facility] ([facility]) [address], [place], [place] Accepts Medicaid  [facility] [place] [address],  [place] [phone] Substance Abuse/Addiction Treatment   [facility] Organization         Address  Phone  Notes  [facility]  [phone]   Domenic Schwab, PhD [address] Arlis [place], [place]   [phone] or [phone]   [place] [facility], [place] [phone]   [facility] [address] [place], [place] [phone] Insurance/Medicaid/sponsorship through [facility] and Families [address]                                    [place], [place] [phone] [place] [address], [place] [phone]    Dr. Adele Schilder  [phone]   [facility] Dept. 1) [address] [place] 2) [place] 3)  [place] [address], [place] [phone]  [phone]   [place] [phone] or [phone] (After Hours)

## 2013-09-01 NOTE — ED Provider Notes (Signed)
CSN: [id number]     Arrival date & time 09/01/13  1051 History  This chart was scribed for non-physician practitioner Junious Silk, PA-C, working with Doug Sou, MD by Leone Payor, ED Scribe. This patient was seen in room TR11C/TR11C and the patient's care was started at 1:24 PM.    Chief Complaint  Patient presents with  . Medication Refill    The history is provided by the patient. No language interpreter was used.    HPI Comments: Kevin Romero is a 33 y.o. male with past medical history of HTN, back pain who presents to the Emergency Department requesting medication refill today. Patient states he has been unable to follow up with PCP due to lack of insurance. Patient states he was prescribed amlodipine for HTN during a prior ED visit as well as flexeril and hydrocodone for chronic back pain. He states the lower back pain is worse on the left and radiates to the posterior left thigh and intermittently down the right thigh. He describes the pain as throbbing and occasionally sharp. He states applied pressure to the left lower back alleviates his pain. He denies bowel or bladder incontinence, nausea, vomiting, HA, lightheadedness. He denies IV drug use. He denies history of cancer.    Past Medical History  Diagnosis Date  . Back pain   . Hypertension    History reviewed. No pertinent past surgical history. History reviewed. No pertinent family history. History  Substance Use Topics  . Smoking status: Never Smoker   . Smokeless tobacco: Not on file  . Alcohol Use: Yes    Review of Systems  Gastrointestinal: Negative for nausea and vomiting.  Musculoskeletal: Positive for back pain.  Neurological: Negative for light-headedness and headaches.      Allergies  Review of patient's allergies indicates no known allergies.  Home Medications   Prior to Admission medications   Medication Sig Start Date End Date Taking? Authorizing Provider  amLODipine (NORVASC) 5 MG  tablet Take 1 tablet (5 mg total) by mouth daily. 08/02/13  Yes Olivia Mackie, MD  cyclobenzaprine (FLEXERIL) 10 MG tablet Take 1 tablet (10 mg total) by mouth 2 (two) times daily as needed for muscle spasms. 08/13/13  Yes Nicole Pisciotta, PA-C  HYDROcodone-acetaminophen (NORCO/VICODIN) 5-325 MG per tablet Take 1-2 tablets by mouth every 6 hours as needed for pain. 08/13/13  Yes Nicole Pisciotta, PA-C  ibuprofen (ADVIL,MOTRIN) 800 MG tablet Take 800 mg by mouth every 8 (eight) hours as needed for mild pain.   Yes Historical Provider, MD  naproxen sodium (ANAPROX) 220 MG tablet Take 440 mg by mouth daily as needed (headache).   Yes Historical Provider, MD   BP 131/77  Pulse 76  Temp(Src) 97.4 F (36.3 C) (Oral)  Resp 20  SpO2 99% Physical Exam  Nursing note and vitals reviewed. Constitutional: He is oriented to person, place, and time. He appears well-developed and well-nourished. No distress.  HENT:  Head: Normocephalic and atraumatic.  Right Ear: External ear normal.  Left Ear: External ear normal.  Nose: Nose normal.  Eyes: Conjunctivae are normal.  Neck: Normal range of motion. No tracheal deviation present.  Cardiovascular: Normal rate, regular rhythm and normal heart sounds.   HR on exam is 80 bpm  Pulmonary/Chest: Effort normal and breath sounds normal. No stridor.  Abdominal: Soft. He exhibits no distension. There is no tenderness.  Musculoskeletal: Normal range of motion.  Tenderness to palpation over lumbar spine. Strength 5/5 in all extremities. Normal gait. 2+ PT  pulses.   Neurological: He is alert and oriented to person, place, and time.  Skin: Skin is warm and dry. He is not diaphoretic.  Psychiatric: He has a normal mood and affect. His behavior is normal.    ED Course  Procedures (including critical care time)  DIAGNOSTIC STUDIES: Oxygen Saturation is 99% on RA, normal by my interpretation.    COORDINATION OF CARE: 1:31 PM Discussed treatment plan with pt at bedside  and pt agreed to plan.   Labs Review Labs Reviewed - No data to display  Imaging Review No results found.   EKG Interpretation None      MDM   Final diagnoses:  Medication refill  Low back pain with left-sided sciatica, unspecified back pain laterality   Patient presents to ED for medication refill. Patient was given month supply of norvasc for hypertension. Patient was given small amount of flexeril and norco for back pain. Discussed with patient the ED is not appropriate place for medication refill. No new injuries with back. No red flags. No concern for cauda equina, epidural abscess, or other emergent pathology today. Discussed reasons to return to ED immediately. Vital signs stable for discharge. Patient / Family / Caregiver informed of clinical course, understand medical decision-making process, and agree with plan.    I personally performed the services described in this documentation, which was scribed in my presence. The recorded information has been reviewed and is accurate.    Mora BellmanHannah S Pahola Dimmitt, PA-C 09/01/13 1625

## 2013-09-01 NOTE — ED Notes (Signed)
Pt reports he needs a refill on his B/P meds. Pain Meds.

## 2013-09-01 NOTE — ED Notes (Signed)
Declined W/C at D/C and was escorted to lobby by RN. 

## 2013-09-01 NOTE — ED Provider Notes (Signed)
Medical screening examination/treatment/procedure(s) were performed by non-physician practitioner and as supervising physician I was immediately available for consultation/collaboration.   EKG Interpretation None       Doug SouSam Karon Heckendorn, MD 09/01/13 1811

## 2013-10-04 ENCOUNTER — Emergency Department (HOSPITAL_COMMUNITY)
Admission: EM | Admit: 2013-10-04 | Discharge: 2013-10-04 | Disposition: A | Discharge status: Home / Self Care | Payer: Self-pay | Attending: Emergency Medicine | Admitting: Emergency Medicine

## 2013-10-04 DIAGNOSIS — R51 Headache: Secondary | ICD-10-CM | POA: Insufficient documentation

## 2013-10-04 DIAGNOSIS — Z760 Encounter for issue of repeat prescription: Secondary | ICD-10-CM | POA: Insufficient documentation

## 2013-10-04 DIAGNOSIS — I10 Essential (primary) hypertension: Secondary | ICD-10-CM | POA: Insufficient documentation

## 2013-10-04 DIAGNOSIS — M549 Dorsalgia, unspecified: Secondary | ICD-10-CM | POA: Insufficient documentation

## 2013-10-04 MED ORDER — AMLODIPINE BESYLATE 5 MG PO TABS: 5.0000 mg | ORAL_TABLET | Freq: Every day | ORAL | Status: DC

## 2013-10-04 NOTE — ED Notes (Signed)
Requesting amlodipine 5 mg refill-out for 4 days. BP 148/86 at this time.

## 2013-10-04 NOTE — Discharge Instructions (Signed)
Medication Refill, Emergency Department °We have refilled your medication today as a courtesy to you. It is best for your medical care, however, to take care of getting refills done through your primary caregiver's office. They have your records and can do a better job of follow-up than we can in the emergency department. °On maintenance medications, we often only prescribe enough medications to get you by until you are able to see your regular caregiver. This is a more expensive way to refill medications. °In the future, please plan for refills so that you will not have to use the emergency department for this. °Thank you for your help. Your help allows us to better take care of the daily emergencies that enter our department. °Document Released: 05/13/2003 Document Revised: 04/18/2011 Document Reviewed: 05/03/2013 °ExitCare® Patient Information ©2015 ExitCare, LLC. This information is not intended to replace advice given to you by your health care provider. Make sure you discuss any questions you have with your health care provider. ° °

## 2013-10-04 NOTE — ED Provider Notes (Signed)
CSN: [id number]     Arrival date & time 10/04/13  1606 History  This chart was scribed for non-physician practitioner working with Gilda Crease, * by Elveria Rising, ED Scribe. This patient was seen in room TR07C/TR07C and the patient's care was started at 4:32 PM.   Chief Complaint  Patient presents with  . Medication Refill    The history is provided by the patient. No language interpreter was used.   HPI Comments: Kevin Romero is a 33 y.o. male with history of Hypertension and lower back who presents to the Emergency Department requesting medication refill: Amlodipine . Patient exhausted medication four days ago. Patient states that he is actively seeking a PCP, but he does not insurance and is have difficulty. Patient states that he takes his medication as directed. Patient checks his blood pressure twice a week. Last he checked it measured 122/80, which is typical.  His last kidney function test was approximately one month ago. Patient denies chest pain, shortness of breath, headaches or blurred vision.   In regards to his back pain, patient states that he hasn't needed to take his Flexeril or Hydrocodone in the last 3-4 days. He does however report occasional left sciatica.     Past Medical History  Diagnosis Date  . Back pain   . Hypertension    No past surgical history on file. No family history on file. History  Substance Use Topics  . Smoking status: Never Smoker   . Smokeless tobacco: Not on file  . Alcohol Use: Yes    Review of Systems  Constitutional: Negative for fever and chills.  Respiratory: Negative for shortness of breath.   Cardiovascular: Negative for chest pain.  Musculoskeletal: Positive for back pain.  Neurological: Positive for headaches.  All other systems reviewed and are negative.     Allergies  Review of patient's allergies indicates no known allergies.  Home Medications   Prior to Admission medications   Medication Sig  Start Date End Date Taking? Authorizing Provider  amLODipine (NORVASC) 5 MG tablet Take 1 tablet (5 mg total) by mouth daily. 08/02/13  Yes Olivia Mackie, MD  cyclobenzaprine (FLEXERIL) 10 MG tablet Take 1 tablet (10 mg total) by mouth 2 (two) times daily as needed for muscle spasms. 08/13/13  Yes Nicole Pisciotta, PA-C  HYDROcodone-acetaminophen (NORCO/VICODIN) 5-325 MG per tablet Take 1-2 tablets by mouth every 6 hours as needed for pain. 08/13/13  Yes Nicole Pisciotta, PA-C  naproxen sodium (ANAPROX) 220 MG tablet Take 440 mg by mouth daily as needed (headache).   Yes Historical Provider, MD   Triage Vitals: BP 148/86  Pulse 86  Temp(Src) 98.5 F (36.9 C) (Oral)  Resp 16  Ht  (1.88 m)  Wt 214 lb (97.07 kg)  BMI 27.46 kg/m2  SpO2 97%  Physical Exam  Nursing note and vitals reviewed. Constitutional: He is oriented to person, place, and time. He appears well-developed and well-nourished. No distress.  HENT:  Head: Normocephalic and atraumatic.  Eyes: EOM are normal.  Neck: Neck supple.  Cardiovascular: Normal rate, regular rhythm, normal heart sounds and intact distal pulses.   Pulmonary/Chest: Effort normal and breath sounds normal.  Musculoskeletal: Normal range of motion.  Neurological: He is alert and oriented to person, place, and time.  Skin: Skin is warm and dry. He is not diaphoretic.  Psychiatric: He has a normal mood and affect. His behavior is normal.    ED Course  Procedures (including critical care time)  COORDINATION OF CARE: 4:39 PM- Discussed treatment plan with patient at bedside and patient agreed to plan.   Labs Review Labs Reviewed - No data to display  Imaging Review No results found.   EKG Interpretation None     Patient with history of hypertension, currently treated with amlodipine.  Patient is continuing to work on obtaining a primary care provider, but has not yet been successful.  He states he will be seen at [facility] next  week.  Today he is requesting a refill of his norvasc.  Patient states his blood pressure has been well controlled with current therapy.  Most recent labs reviewed, normal renal function and GFR.   MDM   Final diagnoses:  None    Medication refill  I personally performed the services described in this documentation, which was scribed in my presence. The recorded information has been reviewed and is accurate.    Jimmye Norman, NP 10/04/13 1655

## 2013-10-05 NOTE — ED Provider Notes (Signed)
Medical screening examination/treatment/procedure(s) were performed by non-physician practitioner and as supervising physician I was immediately available for consultation/collaboration.   EKG Interpretation None        Gilda Crease, MD 10/05/13 [phone]

## 2013-11-12 ENCOUNTER — Emergency Department (HOSPITAL_COMMUNITY)
Admission: EM | Admit: 2013-11-12 | Discharge: 2013-11-12 | Disposition: A | Discharge status: Home / Self Care | Payer: Self-pay | Attending: Emergency Medicine | Admitting: Emergency Medicine

## 2013-11-12 ENCOUNTER — Emergency Department (HOSPITAL_COMMUNITY): Payer: Self-pay

## 2013-11-12 ENCOUNTER — Encounter (HOSPITAL_COMMUNITY): Payer: Self-pay | Admitting: Emergency Medicine

## 2013-11-12 DIAGNOSIS — Z7982 Long term (current) use of aspirin: Secondary | ICD-10-CM | POA: Insufficient documentation

## 2013-11-12 DIAGNOSIS — R079 Chest pain, unspecified: Secondary | ICD-10-CM | POA: Insufficient documentation

## 2013-11-12 DIAGNOSIS — Z79899 Other long term (current) drug therapy: Secondary | ICD-10-CM | POA: Insufficient documentation

## 2013-11-12 DIAGNOSIS — I10 Essential (primary) hypertension: Secondary | ICD-10-CM | POA: Insufficient documentation

## 2013-11-12 LAB — CBC
HCT: 38.8 % — ABNORMAL LOW (ref 39.0–52.0)
Hemoglobin: 13.4 g/dL (ref 13.0–17.0)
MCH: 31 pg (ref 26.0–34.0)
MCHC: 34.5 g/dL (ref 30.0–36.0)
MCV: 89.8 fL (ref 78.0–100.0)
Platelets: 296 10*3/uL (ref 150–400)
RBC: 4.32 MIL/uL (ref 4.22–5.81)
RDW: 11.9 % (ref 11.5–15.5)
WBC: 9.8 10*3/uL (ref 4.0–10.5)

## 2013-11-12 LAB — BASIC METABOLIC PANEL
Anion gap: 12 (ref 5–15)
BUN: 18 mg/dL (ref 6–23)
CO2: 28 mEq/L (ref 19–32)
Calcium: 9.5 mg/dL (ref 8.4–10.5)
Chloride: 100 mEq/L (ref 96–112)
Creatinine, Ser: 1.11 mg/dL (ref 0.50–1.35)
GFR calc Af Amer: >90 mL/min (ref >90)
GFR calc non Af Amer: 85 mL/min — ABNORMAL LOW (ref >90)
Glucose, Bld: 94 mg/dL (ref 70–99)
Potassium: 3.8 mEq/L (ref 3.7–5.3)
Sodium: 140 mEq/L (ref 137–147)

## 2013-11-12 LAB — I-STAT TROPONIN, ED: Troponin i, poc: 0.01 ng/mL (ref 0.00–0.08)

## 2013-11-12 MED ORDER — AMLODIPINE BESYLATE 5 MG PO TABS: 5.0000 mg | ORAL_TABLET | Freq: Every day | ORAL | Status: DC

## 2013-11-12 MED ORDER — CYCLOBENZAPRINE HCL 10 MG PO TABS: 10.0000 mg | ORAL_TABLET | Freq: Two times a day (BID) | ORAL | Status: DC | PRN

## 2013-11-12 NOTE — ED Notes (Signed)
Pt. reports intermittent mid/right chest pain/chest pressure  for several  months , denies SOB , no nausea or diaphoresis , no chest pain at triage .

## 2013-11-12 NOTE — Discharge Instructions (Signed)
Chest Pain (Nonspecific) °It is often hard to give a specific diagnosis for the cause of chest pain. There is always a chance that your pain could be related to something serious, such as a heart attack or a blood clot in the lungs. You need to follow up with your health care provider for further evaluation. °CAUSES  °· Heartburn. °· Pneumonia or bronchitis. °· Anxiety or stress. °· Inflammation around your heart (pericarditis) or lung (pleuritis or pleurisy). °· A blood clot in the lung. °· A collapsed lung (pneumothorax). It can develop suddenly on its own (spontaneous pneumothorax) or from trauma to the chest. °· Shingles infection (herpes zoster virus). °The chest wall is composed of bones, muscles, and cartilage. Any of these can be the source of the pain. °· The bones can be bruised by injury. °· The muscles or cartilage can be strained by coughing or overwork. °· The cartilage can be affected by inflammation and become sore (costochondritis). °DIAGNOSIS  °Lab tests or other studies may be needed to find the cause of your pain. Your health care provider may have you take a test called an ambulatory electrocardiogram (ECG). An ECG records your heartbeat patterns over a 24-hour period. You may also have other tests, such as: °· Transthoracic echocardiogram (TTE). During echocardiography, sound waves are used to evaluate how blood flows through your heart. °· Transesophageal echocardiogram (TEE). °· Cardiac monitoring. This allows your health care provider to monitor your heart rate and rhythm in real time. °· Holter monitor. This is a portable device that records your heartbeat and can help diagnose heart arrhythmias. It allows your health care provider to track your heart activity for several days, if needed. °· Stress tests by exercise or by giving medicine that makes the heart beat faster. °TREATMENT  °· Treatment depends on what may be causing your chest pain. Treatment may include: °¨ Acid blockers for  heartburn. °¨ Anti-inflammatory medicine. °¨ Pain medicine for inflammatory conditions. °¨ Antibiotics if an infection is present. °· You may be advised to change lifestyle habits. This includes stopping smoking and avoiding alcohol, caffeine, and chocolate. °· You may be advised to keep your head raised (elevated) when sleeping. This reduces the chance of acid going backward from your stomach into your esophagus. °Most of the time, nonspecific chest pain will improve within 2-3 days with rest and mild pain medicine.  °HOME CARE INSTRUCTIONS  °· If antibiotics were prescribed, take them as directed. Finish them even if you start to feel better. °· For the next few days, avoid physical activities that bring on chest pain. Continue physical activities as directed. °· Do not use any tobacco products, including cigarettes, chewing tobacco, or electronic cigarettes. °· Avoid drinking alcohol. °· Only take medicine as directed by your health care provider. °· Follow your health care provider's suggestions for further testing if your chest pain does not go away. °· Keep any follow-up appointments you made. If you do not go to an appointment, you could develop lasting (chronic) problems with pain. If there is any problem keeping an appointment, call to reschedule. °SEEK MEDICAL CARE IF:  °· Your chest pain does not go away, even after treatment. °· You have a rash with blisters on your chest. °· You have a fever. °SEEK IMMEDIATE MEDICAL CARE IF:  °· You have increased chest pain or pain that spreads to your arm, neck, jaw, back, or abdomen. °· You have shortness of breath. °· You have an increasing cough, or you cough   up blood. °· You have severe back or abdominal pain. °· You feel nauseous or vomit. °· You have severe weakness. °· You faint. °· You have chills. °This is an emergency. Do not wait to see if the pain will go away. Get medical help at once. Call your local emergency services (911 in [place] Do not drive  yourself to the hospital. °MAKE SURE YOU:  °· Understand these instructions. °· Will watch your condition. °· Will get help right away if you are not doing well or get worse. °Document Released: 11/03/2004 Document Revised: 01/29/2013 Document Reviewed: 08/30/2007 °ExitCare® Patient Information ©2015 [employer]. This information is not intended to replace advice given to you by your health care provider. Make sure you discuss any questions you have with your health care provider. ° ° °Emergency Department Resource Guide °1) Find a Doctor and Pay Out of Pocket °Although you won't have to find out who is covered by your insurance plan, it is a good idea to ask around and get recommendations. You will then need to call the office and see if the doctor you have chosen will accept you as a new patient and what types of options they offer for patients who are self-pay. Some doctors offer discounts or will set up payment plans for their patients who do not have insurance, but you will need to ask so you aren't surprised when you get to your appointment. ° °2) Contact Your Local Health Department °Not all health departments have doctors that can see patients for sick visits, but many do, so it is worth a call to see if yours does. If you don't know where your local health department is, you can check in your phone book. The CDC also has a tool to help you locate your state's health department, and many state websites also have listings of all of their local health departments. ° °3) Find a Walk-in Clinic °If your illness is not likely to be very severe or complicated, you may want to try a walk in clinic. These are popping up all over the country in pharmacies, drugstores, and shopping centers. They're usually staffed by nurse practitioners or physician assistants that have been trained to treat common illnesses and complaints. They're usually fairly quick and inexpensive. However, if you have serious medical issues or  chronic medical problems, these are probably not your best option. ° °No Primary Care Doctor: °- Call [facility] Connect at  832-8000 - they can help you locate a primary care doctor that  accepts your insurance, provides certain services, etc. °- Physician Referral Service- 1-800-533-3463 ° °Chronic Pain Problems: °Organization         Address  Phone   Notes  °[facility]  (336) 297-2271 Patients need to be referred by their primary care doctor.  ° °Medication Assistance: °Organization         Address  Phone   Notes  °[place] [facility] [address] °[place], [place] [address] (336) 641-8030 --Must be a resident of [place] °-- Must have NO insurance coverage whatsoever (no Medicaid/ Medicare, etc.) °-- The pt. MUST have a primary care doctor that directs their care regularly and follows them in the community °  °[facility]  (866) 331-1348   °[employer]  (888) 892-1162   ° °Agencies that provide inexpensive medical care: °Organization         Address  Phone   Notes  °[facility]  (  336) 832-8035   °[facility]    (336) 832-7272   °[facility] [address] °[place], [place] [address] (336) 832-4777   °[facility] [address], °[place] (336) 271-4999   °[facility]    (336) 373-0678   °[facility]    (336) 272-1050   °[facility] ° [address], [place] Phone:  (336) 832-4444, Fax:  (336) 832-4440 Hours of Operation:  9 am - 6 pm, M-F.  Also accepts Medicaid/Medicare and self-pay.  °[facility] ° [address], [place] Phone: (336) 832-3150, Fax: (336) [phone]. Hours of Operation:  8:30 am - 5:30 pm, M-F.  Also accepts Medicaid and self-pay.  °[facility] [place] [address], [place] Phone: (336) 878-6027   °[facility] [address], [place], [place]  (336)723-1848, Ext. 123 Mondays & Thursdays: 7-9 AM.  First 15 patients are seen on a first come, first serve basis. °  ° °Medicaid-accepting [place] Providers: ° °Organization         Address  Phone   Notes  °[facility] [address], [place] (336) 641-2100 Also accepts self-pay patients.  °[facility] [address], [place] ° (336) 856-9996   °[facility] [address], [place] (336) 288-8857   °[facility] [address] [place] [address], [place] (336) 299-7000   °Veita Bland [address], [place]  ° (336) 373-1557 Only accepts [place] Access Medicaid patients after they have their name applied to their card.  ° °Self-Pay (no insurance) in [place]: ° °Organization         Address  Phone   Notes  °Sickle Cell Patients, [facility] [address], [place] (336) 832-1970   °[facility] [address], [place] (336) 832-4400   ° [facility] ° [address], [place] (336) 992-4800   °[facility]/Dr. Osei-Bonsu ° [address] [place] [address], [place] or [address], [place] (336) 841-8500 Phone number for both [place] and [place] locations is the same.  °[facility] [address], [place] (336) 299-0000   °[facility] [address] [place] [address], [place] or [address] (336) 852-7530 °(336) 878-2260   °[facility] [address], [place] (336) 350-1642, phone; (336) 294-5005, fax Sees patients 1st and 3rd Saturday of every month.  Must not qualify for public or private insurance (i.e. Medicaid, Medicare, [place] Health Choice, Veterans' Benefits) • Household income should be no more than 200% of the poverty level •The clinic cannot treat you if you are pregnant or think you are pregnant • Sexually transmitted  diseases are not treated at the clinic.  ° ° °Dental Care: °Organization         Address  Phone  Notes  °[facility] [address], [place] (336) 641-6152 Accepts children up to age 21 who are enrolled in Medicaid or [place] Health Choice; pregnant women with a Medicaid card; and children who have applied for Medicaid or [place] Health Choice, but were declined, whose parents can pay a reduced fee at time of service.  °[facility] [place]    [address], [place] (336) 641-7733 Accepts children up to age 21 who are enrolled in Medicaid or [place] Health Choice; pregnant women with a Medicaid card; and children who have applied for Medicaid or [employer] Health Choice, but were declined, whose parents can pay a reduced fee at time of service.  °[facility] ° [address], [place] (336) 641-4533 Patients are seen by appointment only. Walk-ins are not accepted. [facility] will see patients 18 years of age and older. °Monday - Tuesday (8am-5pm) °Most Wednesdays (8:30-5pm) °$30 per visit, cash only  °[facility] ° [address], [place] (336) 641-4533 Patients are seen by appointment only. Walk-ins are not accepted. [facility] will see patients 18 years of age and older. °One Wednesday Evening (Monthly: Volunteer Based).  $30 per visit, cash only  °[facility]  (919) 537-3737 for adults; Children under age 4, call Graduate Pediatric Dentistry at ([phone]) [phone]. Children aged 4-14, please call (919) [phone] to request a pediatric application. ° Dental services are provided in all areas of dental care including fillings, crowns and bridges, complete and partial dentures, implants, gum treatment, root canals, and extractions. Preventive care is also provided. Treatment is provided to both adults and children. °Patients are selected via a  lottery and there is often a waiting list. °  °[facility] [address], °[place] ° (336) 763-8833 [email or web address] °  °[facility] [address], [place], [place] (336)723-1848, Ext. 123 Second and Fourth Thursday of each month, opens at 6:30 AM; Clinic ends at 9 AM.  Patients are seen on a first-come first-served basis, and a limited number are seen during each clinic.  ° °[facility] ° [address], [place], [place] (336) 723-7904   Eligibility Requirements °You must have lived in [place], [place], or [place] counties for at least the last three months. °  You cannot be eligible for state or federal sponsored healthcare insurance, including Veterans Administration, Medicaid, or Medicare. °  You generally cannot be eligible for healthcare insurance through your employer.  °  How to apply: °Eligibility screenings are held every Tuesday and Wednesday afternoon from 1:00 pm until 4:00 pm. You do not need an appointment for the interview!  °[facility] [address], [place], [place] 336-631-2330   °[facility]  336-342-8273   °[facility]  336-703-3100   °[facility]  336-570-6415   ° °Behavioral Health Resources in the Community: °Intensive Outpatient Programs °Organization         Address  Phone  Notes  °[facility] [address], [place], [place] 336-878-6098   °[facility] [address], [place], [place] 336-832-9800   °[facility] [address], [place], [place] ° 336-882-2125   °[facility] [address],  °[place], [place] 1-800-853-5163 or 336-641-4981   °Substance Abuse Resources °Organization         Address  Phone  Notes  °[facility]  336-882-2125   °[facility]  336-784-9470   °[facility]  336-285-9073   °[facility]  336-845-3988   °Residential &  Outpatient Substance Abuse Program  1-800-659-3381   °Psychological Services °Organization         Address  Phone  Notes  °[facility]  336- 832-9600   °  [facility]  336- 378-7881   °[facility] [address], [place] 1-800-853-5163 or 336-641-4981   ° °Mobile Crisis Teams °Organization         Address  Phone  Notes  °[facility], Mobile Crisis Care Unit  1-877-626-1772   °Assertive °[facility] ° [address] [place], [place] 336-834-9664   °Sharon DeEsch [address] °[place] 336-554-5454   ° °Self-Help/Support Groups °Organization         Address  Phone             Notes  °[facility] - variety of support groups  336- 373-1402 Call for more information  °Narcotics Anonymous (NA), [facility] [address], °[place]  2 meetings at this location  ° °Residential Treatment Programs °Organization         Address  Phone  Notes  °[facility] [address],    °[place]  1-866-801-8205   °[facility] ° [address], [place], [place] 704-293-8524   °[facility] [address], [place] 336-845-3988 Admissions: 8am-3pm M-F  °[facility] [address]    °[place], [place] 336-841-1104   °[facility] [address], [place], [place] 336-379-7146   °[facility] [address]  °[place], [place] 336-285-9073   °[facility] - Intensive Outpatient [address], [place], [place] 336-852-3033   °[facility] [address]  °[place], [place] 1-877-615-2722 or 336-784-9470   °[facility] [address] [place], [place] 336-227-7417 Accepts Medicaid  °[facility] [address]  ° [place] 1-800-659-3381 Substance Abuse/Addiction Treatment  ° °[place] Behavioral Health Resources °Organization          Address  Phone  Notes  °[facility]  (888) 581-9988   °Julie Brannon, PhD [address] [place], [place]   (336) 349-5553 or (336) 951-0000   °[facility]   [address] °[place], [place] (336) 349-4454   °[facility] [address], [place], [place] (336) 342-8316 Insurance/Medicaid/sponsorship through [facility]  °[facility] [address]                                    [place], [place] (336) 342-8316 Therapy/tele-psych/case  °[facility] [address]  ° [place], [place] (336) 349-2233    °Dr. Arfeen  (336) 349-4544   °[facility] [place]  [employer] [place] [facility] 1) [address], [place] °2) [address], [place] °3)  [address], [place] (336) 349-3220 °(336) 342-7768 ° °(336) 342-8140   °[place] [facility] (336) 342-1394 or (336) 342-3537 (After Hours)    ° ° ° °

## 2013-11-20 NOTE — ED Provider Notes (Signed)
CSN: [id number]     Arrival date & time 11/12/13  0105 History   First MD Initiated Contact with Patient 11/12/13 0308     Chief Complaint  Patient presents with  . Chest Pain     (Consider location/radiation/quality/duration/timing/severity/associated sxs/prior Treatment) HPI  [age] year old male with chest pain. Ongoing for several months. Intermittent. He describes a pressure/tightness substernally on the right side. Sometimes last minutes sometimes just a few seconds. No appreciable exacerbating relieving factors. No cough. No shortness of breath. No associated nausea or diaphoresis. Currently no pain. No dizziness or lightheadedness. No unusual leg pain or swelling. He reports a past history of hypertension and is requesting refill for medication. He's been not taking his blood pressure medication for several weeks now.  Past Medical History  Diagnosis Date  . Back pain   . Hypertension    History reviewed. No pertinent past surgical history. No family history on file. History  Substance Use Topics  . Smoking status: Never Smoker   . Smokeless tobacco: Not on file  . Alcohol Use: Yes    Review of Systems  All systems reviewed and negative, other than as noted in HPI.   Allergies  Review of patient's allergies indicates no known allergies.  Home Medications   Prior to Admission medications   Medication Sig Start Date End Date Taking? Authorizing Provider  aspirin 325 MG tablet Take 160 mg by mouth daily as needed for mild pain.   Yes Historical Provider, MD  HYDROcodone-acetaminophen (NORCO/VICODIN) 5-325 MG per tablet Take 1-2 tablets by mouth every 6 hours as needed for pain. 08/13/13  Yes Nicole Pisciotta, PA-C  naproxen sodium (ANAPROX) 220 MG tablet Take 440 mg by mouth daily as needed (headache).   Yes Historical Provider, MD  amLODipine (NORVASC) 5 MG tablet Take 1 tablet (5 mg total) by mouth daily. 11/12/13   Raeford RazorStephen Haitham Dolinsky, MD  cyclobenzaprine (FLEXERIL) 10 MG  tablet Take 1 tablet (10 mg total) by mouth 2 (two) times daily as needed for muscle spasms. 11/12/13   Raeford RazorStephen Zeena Starkel, MD   BP 118/80  Pulse 65  Temp(Src) 97.8 F (36.6 C) (Oral)  Resp 14  Ht 6\' 2"  (1.88 m)  Wt 220 lb (99.791 kg)  BMI 28.23 kg/m2  SpO2 99% Physical Exam  Nursing note and vitals reviewed. Constitutional: He appears well-developed and well-nourished. No distress.  HENT:  Head: Normocephalic and atraumatic.  Eyes: Conjunctivae are normal. Right eye exhibits no discharge. Left eye exhibits no discharge.  Neck: Neck supple.  Cardiovascular: Normal rate, regular rhythm and normal heart sounds.  Exam reveals no gallop and no friction rub.   No murmur heard. Pulmonary/Chest: Effort normal and breath sounds normal. No respiratory distress. He exhibits no tenderness.  Abdominal: Soft. He exhibits no distension. There is no tenderness.  Musculoskeletal: He exhibits no edema and no tenderness.  Lower extremities symmetric as compared to each other. No calf tenderness. Negative Homan's. No palpable cords.   Neurological: He is alert.  Skin: Skin is warm and dry.  Psychiatric: He has a normal mood and affect. His behavior is normal. Thought content normal.    ED Course  Procedures (including critical care time) Labs Review Labs Reviewed  CBC - Abnormal; Notable for the following:    HCT 38.8 (*)    All other components within normal limits  BASIC METABOLIC PANEL - Abnormal; Notable for the following:    GFR calc non Af Amer 85 (*)    All other components within  normal limits  I-STAT TROPOININ, ED    Imaging Review No results found.  Dg Chest 2 View  11/12/2013   CLINICAL DATA:  Acute onset of pain under the right breast, with mid chest pressure. Initial encounter.  EXAM: CHEST  2 VIEW  COMPARISON:  Chest radiograph performed 08/01/2013  FINDINGS: The lungs are well-aerated and clear. There is no evidence of focal opacification, pleural effusion or pneumothorax.  The  heart is normal in size; the mediastinal contour is within normal limits. No acute osseous abnormalities are seen.  IMPRESSION: No acute cardiopulmonary process seen.   Electronically Signed   By: Roanna RaiderJeffery  Chang M.D.   On: 11/12/2013 02:42    EKG Interpretation   Date/Time:  Tuesday November 12 2013 01:22:45 EDT Ventricular Rate:  67 PR Interval:  162 QRS Duration: 106 QT Interval:  382 QTC Calculation: 403 R Axis:   79 Text Interpretation:  Normal sinus rhythm with sinus arrhythmia Moderate  voltage criteria for LVH, may be normal variant has not changed Confirmed  by Maile Linford  MD, Adi Seales ([id number]) on 11/12/2013 3:59:54 AM      MDM   Final diagnoses:  Chest pain, unspecified chest pain type   33 year old male with chest pain. Atypical for ACS. Consider other potential emergent process such as pulmonary embolism, infectious, dissection or other. Very low suspicion. Is appropriate for discharge at this time. Return precautions were discussed. Additionally, patient reports being out of his blood pressure medication. Several blood pressure readings the emergency room have been normal. He was provided with a prescription for a previously diagnosed medication. Instructed him to keep a log of his blood pressure medicines over the next several days. He needs to be cautious that his blood pressure did subsequently drop further. Discussed importance of reestablishing PCP.    Raeford RazorStephen Cataleyah Colborn, MD 11/20/13 1438

## 2014-03-12 ENCOUNTER — Emergency Department (HOSPITAL_COMMUNITY)
Admission: EM | Admit: 2014-03-12 | Discharge: 2014-03-12 | Disposition: A | Discharge status: Home / Self Care | Payer: 59 | Attending: Emergency Medicine | Admitting: Emergency Medicine

## 2014-03-12 ENCOUNTER — Encounter (HOSPITAL_COMMUNITY): Payer: Self-pay

## 2014-03-12 DIAGNOSIS — M545 Low back pain: Secondary | ICD-10-CM | POA: Diagnosis present

## 2014-03-12 DIAGNOSIS — Z87891 Personal history of nicotine dependence: Secondary | ICD-10-CM | POA: Insufficient documentation

## 2014-03-12 DIAGNOSIS — I10 Essential (primary) hypertension: Secondary | ICD-10-CM | POA: Diagnosis not present

## 2014-03-12 DIAGNOSIS — M5417 Radiculopathy, lumbosacral region: Secondary | ICD-10-CM | POA: Diagnosis not present

## 2014-03-12 MED ORDER — HYDROCODONE-ACETAMINOPHEN 5-325 MG PO TABS: 1.0000 | ORAL_TABLET | Freq: Four times a day (QID) | ORAL | Status: DC | PRN

## 2014-03-12 MED ORDER — PREDNISONE 20 MG PO TABS: 40.0000 mg | ORAL_TABLET | Freq: Every day | ORAL | Status: DC

## 2014-03-12 NOTE — ED Provider Notes (Signed)
CSN: [id number]     Arrival date & time 03/12/14  1113 History  This chart was scribed for non-physician practitioner working with Flint MelterElliott L Wentz, MD by Richarda Overlieichard Holland, ED Scribe. This patient was seen in room TR06C/TR06C and the patient's care was started at 1:11 PM.  Chief Complaint  Patient presents with  . Sciatica   The history is provided by the patient. No language interpreter was used.   HPI Comments: [name] is a 34 y.o. male with a history of HTN and back pain who presents to the Emergency Department complaining of lower back pain that started last night. He reports that his pain radiates down his right leg. Pt states it "feels like everything cringes up in my lower back." Pt says he did not do any heavy lifting yesterday. Pt reports that he took ibuprofen at 4AM this morning that failed to provide him any pain relief. He says that he has been told he has sciatica pain. Pt states he has not seen his PCP yet but does plan to see them soon. He denies numbness or weakness in his feet. Pt denies fever and any urinary or bowel symptoms. He reports NKDA.    Past Medical History  Diagnosis Date  . Back pain   . Hypertension    History reviewed. No pertinent past surgical history. History reviewed. No pertinent family history. History  Substance Use Topics  . Smoking status: Former Smoker    Quit date: 03/05/2014  . Smokeless tobacco: Not on file  . Alcohol Use: Yes    Review of Systems  Constitutional: Negative for fever.  Musculoskeletal: Positive for myalgias and back pain.   Allergies  Review of patient's allergies indicates no known allergies.  Home Medications   Prior to Admission medications   Medication Sig Start Date End Date Taking? Authorizing Provider  amLODipine (NORVASC) 5 MG tablet Take 1 tablet (5 mg total) by mouth daily. 11/12/13   Raeford RazorStephen Kohut, MD  aspirin 325 MG tablet Take 160 mg by mouth daily as needed for mild pain.    Historical Provider,  MD  cyclobenzaprine (FLEXERIL) 10 MG tablet Take 1 tablet (10 mg total) by mouth 2 (two) times daily as needed for muscle spasms. 11/12/13   Raeford RazorStephen Kohut, MD  HYDROcodone-acetaminophen (NORCO/VICODIN) 5-325 MG per tablet Take 1-2 tablets by mouth every 6 hours as needed for pain. 08/13/13   Nicole Pisciotta, PA-C  naproxen sodium (ANAPROX) 220 MG tablet Take 440 mg by mouth daily as needed (headache).    Historical Provider, MD   BP 146/100 mmHg  Pulse 96  Temp(Src) 98.5 F (36.9 C) (Oral)  Resp 18  Ht 6\' 2"  (1.88 m)  Wt 220 lb (99.791 kg)  BMI 28.23 kg/m2  SpO2 97% Physical Exam  Constitutional: He is oriented to person, place, and time. He appears well-developed and well-nourished.  HENT:  Head: Normocephalic and atraumatic.  Eyes: Right eye exhibits no discharge. Left eye exhibits no discharge.  Neck: Neck supple. No tracheal deviation present.  Cardiovascular: Normal rate.   Pulmonary/Chest: Effort normal. No respiratory distress.  Abdominal: He exhibits no distension.  Musculoskeletal:  Midline lumbar spine tenderness, pain with bilateral straight leg raise. No paravertebral tenderness.  Neurological: He is alert and oriented to person, place, and time.  5/5 and equal lower extremity strength. 2+ and equal patellar reflexes bilaterally. Pt able to dorsiflex bilateral toes and feet with good strength against resistance. Equal sensation bilaterally over thighs and lower legs.  Skin: Skin is warm and dry.  Psychiatric: He has a normal mood and affect. His behavior is normal.  Nursing note and vitals reviewed.   ED Course  Procedures   DIAGNOSTIC STUDIES: Oxygen Saturation is 97% on RA, normal by my interpretation.    COORDINATION OF CARE: 1:15 PM Discussed treatment plan with pt at bedside and pt agreed to plan.   Labs Review Labs Reviewed - No data to display  Imaging Review No results found.   EKG Interpretation None      MDM   Final diagnoses:  Lumbosacral  radiculopathy   Patient with lower back pain, history of back surgery approximately 5 years ago. Patient denies any new injuries. He is neurovascularly intact on exam. No evidence of cauda equina. He is afebrile. Most likely exacerbation of chronic injury. Home with prednisone, norco, flexeril. Follow up with pcp.   Filed Vitals:   03/12/14 1125 03/12/14 1334  BP: 146/100 147/84  Pulse: 96 77  Temp: 98.5 F (36.9 C)   TempSrc: Oral   Resp: 18 16  Height:  (1.88 m)   Weight: 220 lb (99.791 kg)   SpO2: 97% 100%   I personally performed the services described in this documentation, which was scribed in my presence. The recorded information has been reviewed and is accurate.     Lottie Mussel, PA-C 03/12/14 1516  Flint Melter, MD 03/13/14 [id number]

## 2014-03-12 NOTE — ED Notes (Signed)
PT reports a HX of back surgery with rods approx. 5 years ago.

## 2014-03-12 NOTE — ED Notes (Signed)
Pt with reported sciatica pain that started last night.  Sts pain is currently sitting in back but in the past has radiated down right leg. Ambulatory in triage.

## 2014-03-12 NOTE — Discharge Instructions (Signed)
Prednisone until all gone for inflammation. Take norco as prescribed as needed for severe pain. Follow up with your doctor for recheck and further treatment.    Back Pain, Adult Back pain is very common. The pain often gets better over time. The cause of back pain is usually not dangerous. Most people can learn to manage their back pain on their own.  HOME CARE   Stay active. Start with short walks on flat ground if you can. Try to walk farther each day.  Do not sit, drive, or stand in one place for more than 30 minutes. Do not stay in bed.  Do not avoid exercise or work. Activity can help your back heal faster.  Be careful when you bend or lift an object. Bend at your knees, keep the object close to you, and do not twist.  Sleep on a firm mattress. Lie on your side, and bend your knees. If you lie on your back, put a pillow under your knees.  Only take medicines as told by your doctor.  Put ice on the injured area.  Put ice in a plastic bag.  Place a towel between your skin and the bag.  Leave the ice on for 15-20 minutes, 03-04 times a day for the first 2 to 3 days. After that, you can switch between ice and heat packs.  Ask your doctor about back exercises or massage.  Avoid feeling anxious or stressed. Find good ways to deal with stress, such as exercise. GET HELP RIGHT AWAY IF:   Your pain does not go away with rest or medicine.  Your pain does not go away in 1 week.  You have new problems.  You do not feel well.  The pain spreads into your legs.  You cannot control when you poop (bowel movement) or pee (urinate).  Your arms or legs feel weak or lose feeling (numbness).  You feel sick to your stomach (nauseous) or throw up (vomit).  You have belly (abdominal) pain.  You feel like you may pass out (faint). MAKE SURE YOU:   Understand these instructions.  Will watch your condition.  Will get help right away if you are not doing well or get worse. Document  Released: 07/13/2007 Document Revised: 04/18/2011 Document Reviewed: 05/28/2013 [facility] Patient Information 2015 [place], [place]. This information is not intended to replace advice given to you by your health care provider. Make sure you discuss any questions you have with your health care provider.    Back Exercises Back exercises help treat and prevent back injuries. The goal of back exercises is to increase the strength of your abdominal and back muscles and the flexibility of your back. These exercises should be started when you no longer have back pain. Back exercises include:  Pelvic Tilt. Lie on your back with your knees bent. Tilt your pelvis until the lower part of your back is against the floor. Hold this position 5 to 10 sec and repeat 5 to 10 times.  Knee to Chest. Pull first 1 knee up against your chest and hold for 20 to 30 seconds, repeat this with the other knee, and then both knees. This may be done with the other leg straight or bent, whichever feels better.  Sit-Ups or Curl-Ups. Bend your knees 90 degrees. Start with tilting your pelvis, and do a partial, slow sit-up, lifting your trunk only 30 to 45 degrees off the floor. Take at least 2 to 3 seconds for each sit-up. Do not  do sit-ups with your knees out straight. If partial sit-ups are difficult, simply do the above but with only tightening your abdominal muscles and holding it as directed.  Hip-Lift. Lie on your back with your knees flexed 90 degrees. Push down with your feet and shoulders as you raise your hips a couple inches off the floor; hold for 10 seconds, repeat 5 to 10 times.  Back arches. Lie on your stomach, propping yourself up on bent elbows. Slowly press on your hands, causing an arch in your low back. Repeat 3 to 5 times. Any initial stiffness and discomfort should lessen with repetition over time.  Shoulder-Lifts. Lie face down with arms beside your body. Keep hips and torso pressed to floor as you slowly lift  your head and shoulders off the floor. Do not overdo your exercises, especially in the beginning. Exercises may cause you some mild back discomfort which lasts for a few minutes; however, if the pain is more severe, or lasts for more than 15 minutes, do not continue exercises until you see your caregiver. Improvement with exercise therapy for back problems is slow.  See your caregivers for assistance with developing a proper back exercise program. Document Released: 03/03/2004 Document Revised: 04/18/2011 Document Reviewed: 11/25/2010 [facility] Patient Information 2015 [place]. This information is not intended to replace advice given to you by your health care provider. Make sure you discuss any questions you have with your health care provider.

## 2014-06-23 ENCOUNTER — Inpatient Hospital Stay (HOSPITAL_COMMUNITY)
Admission: EM | Admit: 2014-06-23 | Discharge: 2014-06-25 | DRG: 287 | Disposition: A | Discharge status: Home / Self Care | Payer: 59 | Attending: Cardiology | Admitting: Cardiology

## 2014-06-23 ENCOUNTER — Encounter (HOSPITAL_COMMUNITY): Payer: Self-pay | Admitting: *Deleted

## 2014-06-23 ENCOUNTER — Emergency Department (HOSPITAL_COMMUNITY): Payer: 59

## 2014-06-23 DIAGNOSIS — Z7982 Long term (current) use of aspirin: Secondary | ICD-10-CM

## 2014-06-23 DIAGNOSIS — R778 Other specified abnormalities of plasma proteins: Secondary | ICD-10-CM

## 2014-06-23 DIAGNOSIS — I309 Acute pericarditis, unspecified: Secondary | ICD-10-CM | POA: Diagnosis not present

## 2014-06-23 DIAGNOSIS — I214 Non-ST elevation (NSTEMI) myocardial infarction: Secondary | ICD-10-CM | POA: Diagnosis not present

## 2014-06-23 DIAGNOSIS — Z8249 Family history of ischemic heart disease and other diseases of the circulatory system: Secondary | ICD-10-CM

## 2014-06-23 DIAGNOSIS — I10 Essential (primary) hypertension: Secondary | ICD-10-CM | POA: Diagnosis not present

## 2014-06-23 DIAGNOSIS — I308 Other forms of acute pericarditis: Principal | ICD-10-CM | POA: Diagnosis present

## 2014-06-23 DIAGNOSIS — R072 Precordial pain: Secondary | ICD-10-CM | POA: Diagnosis not present

## 2014-06-23 DIAGNOSIS — R7989 Other specified abnormal findings of blood chemistry: Secondary | ICD-10-CM

## 2014-06-23 DIAGNOSIS — I209 Angina pectoris, unspecified: Secondary | ICD-10-CM | POA: Diagnosis not present

## 2014-06-23 DIAGNOSIS — R079 Chest pain, unspecified: Secondary | ICD-10-CM | POA: Diagnosis present

## 2014-06-23 DIAGNOSIS — Z87891 Personal history of nicotine dependence: Secondary | ICD-10-CM

## 2014-06-23 DIAGNOSIS — F1290 Cannabis use, unspecified, uncomplicated: Secondary | ICD-10-CM | POA: Diagnosis present

## 2014-06-23 DIAGNOSIS — E785 Hyperlipidemia, unspecified: Secondary | ICD-10-CM | POA: Diagnosis present

## 2014-06-23 HISTORY — DX: Hyperlipidemia, unspecified: E78.5

## 2014-06-23 HISTORY — DX: Acute pericarditis, unspecified: I30.9

## 2014-06-23 LAB — BASIC METABOLIC PANEL
ANION GAP: 9 (ref 5–15)
BUN: 14 mg/dL (ref 6–20)
CALCIUM: 9.5 mg/dL (ref 8.9–10.3)
CO2: 25 mmol/L (ref 22–32)
CREATININE: 1.18 mg/dL (ref 0.61–1.24)
Chloride: 104 mmol/L (ref 101–111)
Glucose, Bld: 78 mg/dL (ref 65–99)
Potassium: 3.6 mmol/L (ref 3.5–5.1)
Sodium: 138 mmol/L (ref 135–145)

## 2014-06-23 LAB — RAPID URINE DRUG SCREEN, HOSP PERFORMED
Amphetamines: NOT DETECTED
Barbiturates: NOT DETECTED
Benzodiazepines: NOT DETECTED
COCAINE: NOT DETECTED
Opiates: NOT DETECTED
Tetrahydrocannabinol: POSITIVE — AB

## 2014-06-23 LAB — CBC
HCT: 39.5 % (ref 39.0–52.0)
Hemoglobin: 13.3 g/dL (ref 13.0–17.0)
MCH: 30.5 pg (ref 26.0–34.0)
MCHC: 33.7 g/dL (ref 30.0–36.0)
MCV: 90.6 fL (ref 78.0–100.0)
PLATELETS: 264 10*3/uL (ref 150–400)
RBC: 4.36 MIL/uL (ref 4.22–5.81)
RDW: 11.9 % (ref 11.5–15.5)
WBC: 9.8 10*3/uL (ref 4.0–10.5)

## 2014-06-23 LAB — TROPONIN I
Troponin I: 0.17 ng/mL — ABNORMAL HIGH (ref <0.031)
Troponin I: 0.2 ng/mL — ABNORMAL HIGH (ref <0.031)

## 2014-06-23 LAB — I-STAT TROPONIN, ED: TROPONIN I, POC: 0.11 ng/mL — AB (ref 0.00–0.08)

## 2014-06-23 LAB — TSH: TSH: 1.433 u[IU]/mL (ref 0.350–4.500)

## 2014-06-23 MED ORDER — METOPROLOL TARTRATE 12.5 MG HALF TABLET
12.5000 mg | ORAL_TABLET | Freq: Two times a day (BID) | ORAL | Status: DC
  Administered 2014-06-23 – 2014-06-25 (×4): 12.5 mg via ORAL
  Filled 2014-06-23 (×4): qty 1

## 2014-06-23 MED ORDER — ASPIRIN EC 81 MG PO TBEC
81.0000 mg | DELAYED_RELEASE_TABLET | Freq: Every day | ORAL | Status: DC
  Administered 2014-06-24 – 2014-06-25 (×2): 81 mg via ORAL
  Filled 2014-06-23 (×2): qty 1

## 2014-06-23 MED ORDER — ONDANSETRON HCL 4 MG/2ML IJ SOLN: 4.0000 mg | Freq: Four times a day (QID) | INTRAMUSCULAR | Status: DC | PRN

## 2014-06-23 MED ORDER — NITROGLYCERIN IN D5W 200-5 MCG/ML-% IV SOLN
5.0000 ug/min | INTRAVENOUS | Status: DC
  Administered 2014-06-23: 5 ug/min via INTRAVENOUS

## 2014-06-23 MED ORDER — HEPARIN (PORCINE) IN NACL 100-0.45 UNIT/ML-% IJ SOLN
1200.0000 [IU]/h | INTRAMUSCULAR | Status: DC
  Administered 2014-06-23: 1400 [IU]/h via INTRAVENOUS
  Filled 2014-06-23 (×2): qty 250

## 2014-06-23 MED ORDER — HEPARIN BOLUS VIA INFUSION
4000.0000 [IU] | Freq: Once | INTRAVENOUS | Status: AC
  Administered 2014-06-23: 4000 [IU] via INTRAVENOUS
  Filled 2014-06-23: qty 4000

## 2014-06-23 MED ORDER — NITROGLYCERIN 0.4 MG SL SUBL: 0.4000 mg | SUBLINGUAL_TABLET | SUBLINGUAL | Status: DC | PRN

## 2014-06-23 MED ORDER — ASPIRIN 81 MG PO CHEW: 324.0000 mg | CHEWABLE_TABLET | Freq: Once | ORAL | Status: DC

## 2014-06-23 MED ORDER — NITROGLYCERIN IN D5W 200-5 MCG/ML-% IV SOLN
0.0000 ug/min | Freq: Once | INTRAVENOUS | Status: AC
Start: 2014-06-23 — End: 2014-06-23
  Administered 2014-06-23: 5 ug/min via INTRAVENOUS
  Filled 2014-06-23: qty 250

## 2014-06-23 MED ORDER — ATORVASTATIN CALCIUM 20 MG PO TABS
20.0000 mg | ORAL_TABLET | Freq: Every day | ORAL | Status: DC
  Administered 2014-06-24: 20 mg via ORAL
  Filled 2014-06-23: qty 1

## 2014-06-23 MED ORDER — ACETAMINOPHEN 325 MG PO TABS: 650.0000 mg | ORAL_TABLET | ORAL | Status: DC | PRN

## 2014-06-23 NOTE — H&P (Signed)
Kevin Romero is an 34 y.o. male.   Chief Complaint: Chest Pain HPI:   The patient is a 34 yo AA male with a history of HTN, back pain, occasional marijuana use, limited ETOH intake.  He reports being on norvasc 1m for awhile but then being taken off it.  He developed chest pain, pressure/tightness last night at 0130hrs after eating a meal.  He took an ASA 325 and the pain eased off for awhile.  He woke up pain free but then after being active during the day, running errands, the pain turned.  At the worst it was 8/10 and waxed and waned.  He has not had any more pain since being on IV NTG but he does not really say it took it away when started.  No radiation to arm, neck, back or jaw or diaphoresis.  The patient currently denies nausea, vomiting, fever, shortness of breath, orthopnea, dizziness, PND, cough, congestion, abdominal pain, hematochezia, melena, lower extremity edema, claudication.   Medications:  None   Past Medical History  Diagnosis Date  . Back pain   . Hypertension     History reviewed. No pertinent past surgical history.  No family history on file. Social History:  reports that he quit smoking about 3 months ago. He does not have any smokeless tobacco history on file. He reports that he drinks alcohol. He reports that he uses illicit drugs (Marijuana) about 7 times per week.  Allergies: No Known Allergies   (Not in a hospital admission)  Results for orders placed or performed during the hospital encounter of 06/23/14 (from the past 48 hour(s))  CBC     Status: None   Collection Time: 06/23/14  3:23 PM  Result Value Ref Range   WBC 9.8 4.0 - 10.5 K/uL   RBC 4.36 4.22 - 5.81 MIL/uL   Hemoglobin 13.3 13.0 - 17.0 g/dL   HCT 39.5 39.0 - 52.0 %   MCV 90.6 78.0 - 100.0 fL   MCH 30.5 26.0 - 34.0 pg   MCHC 33.7 30.0 - 36.0 g/dL   RDW 11.9 11.5 - 15.5 %   Platelets 264 150 - 400 K/uL  Basic metabolic panel     Status: None   Collection Time: 06/23/14   3:23 PM  Result Value Ref Range   Sodium 138 135 - 145 mmol/L   Potassium 3.6 3.5 - 5.1 mmol/L   Chloride 104 101 - 111 mmol/L   CO2 25 22 - 32 mmol/L   Glucose, Bld 78 65 - 99 mg/dL   BUN 14 6 - 20 mg/dL   Creatinine, Ser 1.18 0.61 - 1.24 mg/dL   Calcium 9.5 8.9 - 10.3 mg/dL   GFR calc non Af Amer >60 >60 mL/min   GFR calc Af Amer >60 >60 mL/min    Comment: (NOTE) The eGFR has been calculated using the CKD EPI equation. This calculation has not been validated in all clinical situations. eGFR's persistently <60 mL/min signify possible Chronic Kidney Disease.    Anion gap 9 5 - 15  I[facility]stat troponin, ED  [facility]     Status: Abnormal   Collection Time: 06/23/14  3:28 PM  Result Value Ref Range   Troponin i, poc 0.11 (HH) 0.00 - 0.08 ng/mL   Comment NOTIFIED PHYSICIAN    Comment 3            Comment: Due to the release kinetics of cTnI, a negative result within the  first hours of the onset of symptoms does not rule out myocardial infarction with certainty. If myocardial infarction is still suspected, repeat the test at appropriate intervals.    Dg Chest 2 View  06/23/2014   CLINICAL DATA:  Chest pain  EXAM: CHEST  2 VIEW  COMPARISON:  11/12/2013  FINDINGS: Cardiomediastinal silhouette is stable. No acute infiltrate or pleural effusion. No pulmonary edema. Bony thorax is unremarkable.  IMPRESSION: No active cardiopulmonary disease.   Electronically Signed   By: Lahoma Crocker M.D.   On: 06/23/2014 15:41  Annalaura Sauseda   Review of Systems  Constitutional: Negative for fever.  HENT: Negative for sore throat.   Respiratory: Negative for cough and shortness of breath.   Cardiovascular: Positive for chest pain. Negative for orthopnea, leg swelling and PND.  Gastrointestinal: Negative for nausea, vomiting, abdominal pain, blood in stool and melena.  Musculoskeletal: Positive for back pain (Chronic). Negative for myalgias.  Neurological: Negative for dizziness.  All other systems  reviewed and are negative.   Blood pressure 137/86, pulse 80, temperature 98.2 F (36.8 C), temperature source Oral, resp. rate 18, height _0  (1.905 m), weight 231 lb 4 oz (104.894 kg), SpO2 99 %. Physical Exam  Nursing note and vitals reviewed. Constitutional: He is oriented to person, place, and time. He appears well-developed and well-nourished. No distress.  HENT:  Head: Normocephalic and atraumatic.  Mouth/Throat: No oropharyngeal exudate.  Eyes: Conjunctivae are normal. Pupils are equal, round, and reactive to light. No scleral icterus.  Neck: Normal range of motion. Neck supple. No JVD present.  Cardiovascular: Normal rate, regular rhythm, S1 normal and S2 normal.   Murmur heard.  Systolic murmur is present with a grade of 1/6  Soft systolic MM No carotid bruits  Respiratory: Effort normal and breath sounds normal. He has no wheezes. He has no rales. He exhibits no tenderness.  GI: Soft. Bowel sounds are normal. He exhibits no distension. There is no tenderness.  Musculoskeletal: He exhibits no edema.  Neurological: He is alert and oriented to person, place, and time.  Skin: Skin is warm and dry.  Psychiatric: He has a normal mood and affect.     Assessment/Plan NSTEMI Chest Pain HTN  Plan:  Admitted and cycle troponin.  NPO after MN.  AM lipid panel. TSH.  Continue IV NTG and start heparin for ACS.  Added beta blocker and statin.  Echocardiogram. Possible cardiac MRI in AM vs LHC    HAGER, BRYAN, PAC 06/23/2014, 5:06 PM   The patient was seen, examined and discussed with Tarri Fuller, PA-C and I agree with the above.   34 year old male with recurrent chest pain, the last one started the last night, not exertional, pressure like, slightly worse on exertion with no associated symptoms. The patient has h/o HTN and was taking amlodipine PRN as his BP was labile.  His ECG shows SR, LVH and diffuse early repolarization that is unchanged from ECG done on 08/03/2013.   His  troponin is elevated at 0.11, differential dg includes ACS, myocarditis or myopericarditis.   We will start heparin, follow trend and decide in the am for the next work up. If significant troponin elevation we will schedule a cath, otherwise a stress MRI would evaluate ischemia as well as myocarditis and pericarditis.   We will order echocardiogram.  Dorothy Spark 06/23/2014

## 2014-06-23 NOTE — ED Notes (Signed)
Admitting at bedside 

## 2014-06-23 NOTE — ED Provider Notes (Signed)
CSN: [id number]     Arrival date & time 06/23/14  1508 History   First MD Initiated Contact with Patient 06/23/14 1611     Chief Complaint  Patient presents with  . Chest Pain     (Consider location/radiation/quality/duration/timing/severity/associated sxs/prior Treatment) HPI Kevin Romero is a 34 y.o. male with history of hypertension, presents to emergency department complaining of chest pain. Patient states he first noticed pain when laying down last night. He states this morning when he woke up pain was still there. Today pain came and went multiple times. He states it's independent of activity. Describes pain as pressure. Pain is in the center of the chest and does not radiate. No associated symptoms including no dizziness, lightheadedness, diaphoresis, shortness of breath. He states pain got worse just prior to arrival to the emergency department, currently subsided. He denies any recent travel or surgeries. Denies any prior cardiac history. States his mother has hypertension, father CHF, no coronary disease as far as hehe does not smoke cigarettes. He denies cocaine use. He currently does not have a primary care doctor and does not receive regular physical exams. He has no current complaints.  Past Medical History  Diagnosis Date  . Back pain   . Hypertension    History reviewed. No pertinent past surgical history. No family history on file. History  Substance Use Topics  . Smoking status: Former Smoker    Quit date: 03/05/2014  . Smokeless tobacco: Not on file  . Alcohol Use: Yes     Comment: social    Review of Systems  Constitutional: Negative for fever and chills.  Respiratory: Positive for chest tightness. Negative for cough and shortness of breath.   Cardiovascular: Negative for chest pain, palpitations and leg swelling.  Gastrointestinal: Negative for nausea, vomiting, abdominal pain, diarrhea and abdominal distention.  Musculoskeletal: Negative for myalgias,  arthralgias, neck pain and neck stiffness.  Skin: Negative for rash.  Allergic/Immunologic: Negative for immunocompromised state.  Neurological: Negative for dizziness, weakness, light-headedness, numbness and headaches.  All other systems reviewed and are negative.     Allergies  Review of patient's allergies indicates no known allergies.  Home Medications   Prior to Admission medications   Medication Sig Start Date End Date Taking? Authorizing Provider  aspirin 325 MG tablet Take 160 mg by mouth daily as needed for mild pain.   Yes Historical Provider, MD  amLODipine (NORVASC) 5 MG tablet Take 1 tablet (5 mg total) by mouth daily. Patient not taking: Reported on 06/23/2014 11/12/13   Raeford Razor, MD  cyclobenzaprine (FLEXERIL) 10 MG tablet Take 1 tablet (10 mg total) by mouth 2 (two) times daily as needed for muscle spasms. Patient not taking: Reported on 06/23/2014 11/12/13   Raeford Razor, MD  HYDROcodone-acetaminophen [facility]) 5-325 MG per tablet Take 1 tablet by mouth every 6 (six) hours as needed for moderate pain. Patient not taking: Reported on 06/23/2014 03/12/14   Lemont Fillers Deanta Mincey, PA-C  naproxen sodium (ANAPROX) 220 MG tablet Take 440 mg by mouth daily as needed (headache).    Historical Provider, MD  predniSONE (DELTASONE) 20 MG tablet Take 2 tablets (40 mg total) by mouth daily. Patient not taking: Reported on 06/23/2014 03/12/14   Luverne Farone, PA-C   BP 137/75 mmHg  Pulse 83  Temp(Src) 98.2 F (36.8 C) (Oral)  Resp 17  Ht  (1.905 m)  Wt 231 lb 4 oz (104.894 kg)  BMI 28.90 kg/m2  SpO2 99% Physical Exam  Constitutional: He is  oriented to person, place, and time. He appears well-developed and well-nourished. No distress.  HENT:  Head: Normocephalic and atraumatic.  Eyes: Conjunctivae are normal.  Neck: Neck supple.  Cardiovascular: Normal rate, regular rhythm and normal heart sounds.   Pulmonary/Chest: Effort normal. No respiratory distress. He has no  wheezes. He has no rales.  Abdominal: Soft. Bowel sounds are normal. He exhibits no distension. There is no tenderness. There is no rebound.  Musculoskeletal: He exhibits no edema.  Neurological: He is alert and oriented to person, place, and time.  Skin: Skin is warm and dry.  Nursing note and vitals reviewed.   ED Course  Procedures (including critical care time) Labs Review Labs Reviewed  I-STAT TROPOININ, ED - Abnormal; Notable for the following:    Troponin i, poc 0.11 (*)    All other components within normal limits  CBC  BASIC METABOLIC PANEL  URINE RAPID DRUG SCREEN (HOSP PERFORMED)  TROPONIN I    Imaging Review Dg Chest 2 View  06/23/2014   CLINICAL DATA:  Chest pain  EXAM: CHEST  2 VIEW  COMPARISON:  11/12/2013  FINDINGS: Cardiomediastinal silhouette is stable. No acute infiltrate or pleural effusion. No pulmonary edema. Bony thorax is unremarkable.  IMPRESSION: No active cardiopulmonary disease.   Electronically Signed   By: Natasha MeadLiviu  Pop M.D.   On: 06/23/2014 15:41     EKG Interpretation   Date/Time:  Monday Jun 23 2014 15:12:23 EDT Ventricular Rate:  83 PR Interval:  160 QRS Duration: 100 QT Interval:  346 QTC Calculation: 406 R Axis:   76 Text Interpretation:  Normal sinus rhythm Possible Left atrial enlargement  Left ventricular hypertrophy T wave abnormality, consider inferior  ischemia Abnormal ECG Since last tracing more pronounced T wave  abnormality Confirmed by Effie ShyWENTZ  MD, ELLIOTT [phone]) on 06/23/2014 4:33:32  PM        EKG Interpretation  Date/Time:  Monday Jun 23 2014 16:29:05 EDT Ventricular Rate:  69 PR Interval:  176 QRS Duration: 96 QT Interval:  378 QTC Calculation: 405 R Axis:   70 Text Interpretation:  Sinus rhythm Consider left atrial enlargement Probable left ventricular hypertrophy Borderline T abnormalities, inferior leads Borderline ST elevation, lateral leads Since last tracing of earlier today T wave abnormality is less pronounced  Confirmed by Effie ShyWENTZ  MD, ELLIOTT ([id number]) on 06/23/2014 4:42:06 PM        MDM   Final diagnoses:  NSTEMI (non-ST elevated myocardial infarction)      4:28 PM Patient seen and examined. Patient with intermittent chest pain since yesterday, pain is nonexertional. No associated symptoms. Patient is a nonsmoker, no drug use except for marijuana, denies any family history of heart disease. He does not have a primary care doctor at this time. He is unsure of his cholesterol. Patient is EKG shows some new changes, new T-wave inversions, his initial troponin that was obtained in triage came back positive at 0.11. We'll recheck regular troponin, repeat EKG, aspirin and nitroglycerin ordered. Patient states he is currently chest pain-free. His vital signs are normal.  5:02 PM ECG repeated, T wave inversion  Resolved in leads II, AVL, Lateral leads. Pt is chest pain free at this time. States had "crushing pain" when initial ECG obtained. Concerning for dynamic ECG changes and unstable angina. Discussed with Dr. Effie ShyWentz who has seen pt. Will start on nitro drip. Contacted cardiology, they will see pt.   Cardiology to admit patient. Filed Vitals:   06/23/14 1745 06/23/14 1800 06/23/14 1837 06/23/14  2029  BP: 143/84 146/71 139/79 127/80  Pulse: 79 79 66 79  Temp:   98.2 F (36.8 C) 98.4 F (36.9 C)  TempSrc:   Oral Oral  Resp: 14 19 18    Height:   6\' 3"  (1.905 m)   Weight:   227 lb 15.3 oz (103.4 kg)   SpO2: 99% 98% 100% 97%       Jaynie Crumbleatyana Taleyah Hillman, PA-C 06/24/14 0100  Mancel BaleElliott Wentz, MD 06/25/14 0010

## 2014-06-23 NOTE — Progress Notes (Addendum)
ANTICOAGULATION CONSULT NOTE - Initial Consult  Pharmacy Consult for Heparin Indication: chest pain/ACS  No Known Allergies  Patient Measurements: Height: 6\' 3"  (190.5 cm) Weight: 231 lb 4 oz (104.894 kg) IBW/kg (Calculated) : 84.5  Vital Signs: Temp: 98.2 F (36.8 C) (05/16 1515) Temp Source: Oral (05/16 1515) BP: 146/71 mmHg (05/16 1800) Pulse Rate: 79 (05/16 1800)  Labs:  Recent Labs  06/23/14 1523 06/23/14 1524  HGB 13.3  --   HCT 39.5  --   PLT 264  --   CREATININE 1.18  --   TROPONINI  --  0.17*    Estimated Creatinine Clearance: 115.7 mL/min (by C-G formula based on Cr of 1.18).   Medical History: Past Medical History  Diagnosis Date  . Back pain   . Hypertension     Medications:  Scheduled:  . aspirin  324 mg Oral Once   Infusions:    Assessment: 34 yo M presenting to ED on 06/23/2014 with recurrent CP and troponin elevation to 0.11. Pharmacy consulted to dose heparin for potential ACS. CrCl >100 ml/min, CBC wnl with no reported s/s bleeding.  Goal of Therapy:  Heparin level 0.3-0.7 units/ml Monitor platelets by anticoagulation protocol: Yes   Plan:  - Initiate heparin at 4000 units x 1, followed by 1400 units/hr - 6 hour HL - Daily HL/CBC - Monitor for s/s bleeding - F/u plans for cath  [facility] ChicagoErika K. Bonnye FavaNicolsen, PharmD, BCPS Clinical Pharmacist - Resident Pager: [phone] Pharmacy: [phone] 06/23/2014 6:29 PM

## 2014-06-23 NOTE — ED Provider Notes (Signed)
  Face-to-face evaluation   History: He presents for evaluation of worsening chest pain over the last 24 hours. Similar episodes previously. Initially about year ago, then intermittently since then. He was taking blood pressure medication but stopped taking it. He does not have a PCP. Does not know his cholesterol level.   Physical exam: Alert, calm, cooperative. Heart regular rate and rhythm, no murmur. Lungs clear to auscultation. Abdomen soft and nontender.  Medical screening examination/treatment/procedure(s) were conducted as a shared visit with non-physician practitioner(s) and myself.  I personally evaluated the patient during the encounter  Mancel BaleElliott Coleson Kant, MD 06/25/14 0010

## 2014-06-23 NOTE — ED Notes (Signed)
Elevated troponin called from lab, dr Littie Deedsgentry aware

## 2014-06-23 NOTE — ED Notes (Addendum)
Pt c/o midchest chest pain beginning last night. Denies other symptoms. States that he took ASA this morning. States that it helped with the pain for a short amount of time. Pt states similar happened last year and all tests were negative, pts BP was high and was started on BP medication. States that he was taken off the BP medications because his BP returned to normal.

## 2014-06-24 ENCOUNTER — Encounter (HOSPITAL_COMMUNITY)
Admission: EM | Disposition: A | Discharge status: Home / Self Care | Payer: 59 | Source: Home / Self Care | Attending: Cardiology

## 2014-06-24 ENCOUNTER — Other Ambulatory Visit (HOSPITAL_COMMUNITY): Payer: 59

## 2014-06-24 ENCOUNTER — Inpatient Hospital Stay (HOSPITAL_COMMUNITY): Payer: 59

## 2014-06-24 DIAGNOSIS — E785 Hyperlipidemia, unspecified: Secondary | ICD-10-CM

## 2014-06-24 DIAGNOSIS — I509 Heart failure, unspecified: Secondary | ICD-10-CM | POA: Diagnosis not present

## 2014-06-24 DIAGNOSIS — R072 Precordial pain: Secondary | ICD-10-CM | POA: Diagnosis not present

## 2014-06-24 HISTORY — PX: CARDIAC CATHETERIZATION: SHX172

## 2014-06-24 LAB — LIPID PANEL
CHOLESTEROL: 178 mg/dL (ref 0–200)
HDL: 36 mg/dL — AB (ref >40)
LDL Cholesterol: 119 mg/dL — ABNORMAL HIGH (ref 0–99)
Total CHOL/HDL Ratio: 4.9 RATIO
Triglycerides: 117 mg/dL (ref <150)
VLDL: 23 mg/dL (ref 0–40)

## 2014-06-24 LAB — CBC
HEMATOCRIT: 40.6 % (ref 39.0–52.0)
HEMOGLOBIN: 13.6 g/dL (ref 13.0–17.0)
MCH: 30.4 pg (ref 26.0–34.0)
MCHC: 33.5 g/dL (ref 30.0–36.0)
MCV: 90.8 fL (ref 78.0–100.0)
Platelets: 278 10*3/uL (ref 150–400)
RBC: 4.47 MIL/uL (ref 4.22–5.81)
RDW: 12 % (ref 11.5–15.5)
WBC: 9.4 10*3/uL (ref 4.0–10.5)

## 2014-06-24 LAB — HEPARIN LEVEL (UNFRACTIONATED)
Heparin Unfractionated: 0.73 IU/mL — ABNORMAL HIGH (ref 0.30–0.70)
Heparin Unfractionated: 0.75 IU/mL — ABNORMAL HIGH (ref 0.30–0.70)
Heparin Unfractionated: 0.93 IU/mL — ABNORMAL HIGH (ref 0.30–0.70)

## 2014-06-24 LAB — HEMOGLOBIN A1C
Hgb A1c MFr Bld: 5.7 % — ABNORMAL HIGH (ref 4.8–5.6)
Mean Plasma Glucose: 117 mg/dL

## 2014-06-24 LAB — TROPONIN I
TROPONIN I: 0.19 ng/mL — AB (ref <0.031)
TROPONIN I: 0.21 ng/mL — AB (ref <0.031)

## 2014-06-24 SURGERY — LEFT HEART CATH AND CORS/GRAFTS ANGIOGRAPHY

## 2014-06-24 MED ORDER — SODIUM CHLORIDE 0.9 % IJ SOLN: 3.0000 mL | Freq: Two times a day (BID) | INTRAMUSCULAR | Status: DC

## 2014-06-24 MED ORDER — HEPARIN SODIUM (PORCINE) 1000 UNIT/ML IJ SOLN
INTRAMUSCULAR | Status: AC
  Filled 2014-06-24: qty 1

## 2014-06-24 MED ORDER — LIDOCAINE HCL (PF) 1 % IJ SOLN
INTRAMUSCULAR | Status: AC
  Filled 2014-06-24: qty 30

## 2014-06-24 MED ORDER — SODIUM CHLORIDE 0.9 % IV SOLN
INTRAVENOUS | Status: DC
  Administered 2014-06-24: 13:00:00 via INTRAVENOUS

## 2014-06-24 MED ORDER — ASPIRIN 81 MG PO CHEW: 81.0000 mg | CHEWABLE_TABLET | ORAL | Status: DC

## 2014-06-24 MED ORDER — MIDAZOLAM HCL 2 MG/2ML IJ SOLN
INTRAMUSCULAR | Status: DC | PRN
  Administered 2014-06-24: 2 mg via INTRAVENOUS

## 2014-06-24 MED ORDER — SODIUM CHLORIDE 0.9 % WEIGHT BASED INFUSION
1.0000 mL/kg/h | INTRAVENOUS | Status: AC
  Administered 2014-06-24: 1 mL/kg/h via INTRAVENOUS

## 2014-06-24 MED ORDER — SODIUM CHLORIDE 0.9 % IJ SOLN
3.0000 mL | Freq: Two times a day (BID) | INTRAMUSCULAR | Status: DC
  Administered 2014-06-25: 3 mL via INTRAVENOUS

## 2014-06-24 MED ORDER — MIDAZOLAM HCL 2 MG/2ML IJ SOLN
INTRAMUSCULAR | Status: AC
  Filled 2014-06-24: qty 2

## 2014-06-24 MED ORDER — HEPARIN SODIUM (PORCINE) 1000 UNIT/ML IJ SOLN
INTRAMUSCULAR | Status: DC | PRN
  Administered 2014-06-24: 5000 [IU] via INTRAVENOUS

## 2014-06-24 MED ORDER — FENTANYL CITRATE (PF) 100 MCG/2ML IJ SOLN
INTRAMUSCULAR | Status: AC
  Filled 2014-06-24: qty 2

## 2014-06-24 MED ORDER — VERAPAMIL HCL 2.5 MG/ML IV SOLN
INTRAVENOUS | Status: AC
  Filled 2014-06-24: qty 2

## 2014-06-24 MED ORDER — SODIUM CHLORIDE 0.9 % IJ SOLN: 3.0000 mL | INTRAMUSCULAR | Status: DC | PRN

## 2014-06-24 MED ORDER — LORAZEPAM 2 MG/ML IJ SOLN
1.0000 mg | Freq: Four times a day (QID) | INTRAMUSCULAR | Status: DC | PRN
  Administered 2014-06-24: 1 mg via INTRAVENOUS
  Filled 2014-06-24: qty 1

## 2014-06-24 MED ORDER — VERAPAMIL HCL 2.5 MG/ML IV SOLN
INTRAVENOUS | Status: DC | PRN
  Administered 2014-06-24: 15:00:00 via INTRA_ARTERIAL

## 2014-06-24 MED ORDER — HEPARIN (PORCINE) IN NACL 2-0.9 UNIT/ML-% IJ SOLN
INTRAMUSCULAR | Status: AC
  Filled 2014-06-24: qty 1000

## 2014-06-24 MED ORDER — SODIUM CHLORIDE 0.9 % IV SOLN: 250.0000 mL | INTRAVENOUS | Status: DC | PRN

## 2014-06-24 MED ORDER — FENTANYL CITRATE (PF) 100 MCG/2ML IJ SOLN
INTRAMUSCULAR | Status: DC | PRN
  Administered 2014-06-24 (×2): 25 ug via INTRAVENOUS

## 2014-06-24 SURGICAL SUPPLY — 14 items
CATH INFINITI 5 FR JL3.5 (CATHETERS) ×3 IMPLANT
CATH INFINITI 5FR ANG PIGTAIL (CATHETERS) ×3 IMPLANT
CATH INFINITI 5FR MULTPACK ANG (CATHETERS) IMPLANT
CATH INFINITI JR4 5F (CATHETERS) ×3 IMPLANT
DEVICE RAD COMP TR BAND LRG (VASCULAR PRODUCTS) ×3 IMPLANT
GLIDESHEATH SLEND SS 6F .021 (SHEATH) ×3 IMPLANT
KIT HEART LEFT (KITS) ×3 IMPLANT
PACK CARDIAC CATHETERIZATION (CUSTOM PROCEDURE TRAY) ×3 IMPLANT
SHEATH PINNACLE 5F 10CM (SHEATH) IMPLANT
SYR MEDRAD MARK V 150ML (SYRINGE) ×3 IMPLANT
TRANSDUCER W/STOPCOCK (MISCELLANEOUS) ×3 IMPLANT
TUBING CIL FLEX 10 FLL-RA (TUBING) ×3 IMPLANT
WIRE EMERALD 3MM-J .035X150CM (WIRE) IMPLANT
WIRE SAFE-T 1.5MM-J .035X260CM (WIRE) ×3 IMPLANT

## 2014-06-24 NOTE — Progress Notes (Signed)
Patient Name: Kevin Romero Date of Encounter: 06/24/2014  Primary Cardiologist: new - Dr. Delton SeeNelson   Active Problems:   NSTEMI (non-ST elevated myocardial infarction)   Essential hypertension   Chest pain    SUBJECTIVE  Chest pain resolved yesterday after starting nitro. Denies any SOB. Described the pain as burning sensation  CURRENT MEDS . aspirin EC  81 mg Oral Daily  . atorvastatin  20 mg Oral q1800  . metoprolol tartrate  12.5 mg Oral BID WC    OBJECTIVE  Filed Vitals:   06/23/14 1800 06/23/14 1837 06/23/14 2029 06/24/14 0357  BP: 146/71 139/79 127/80 141/75  Pulse: 79 66 79 67  Temp:  98.2 F (36.8 C) 98.4 F (36.9 C) 97.7 F (36.5 C)  TempSrc:  Oral Oral Oral  Resp: 19 18    Height:  6\' 3"  (1.905 m)    Weight:  227 lb 15.3 oz (103.4 kg)  225 lb 14.4 oz (102.468 kg)  SpO2: 98% 100% 97% 98%   No intake or output data in the 24 hours ending 06/24/14 0848 Filed Weights   06/23/14 1515 06/23/14 1837 06/24/14 0357  Weight: 231 lb 4 oz (104.894 kg) 227 lb 15.3 oz (103.4 kg) 225 lb 14.4 oz (102.468 kg)    PHYSICAL EXAM  General: Pleasant, NAD. Neuro: Alert and oriented X 3. Moves all extremities spontaneously. Psych: Normal affect. HEENT:  Normal  Neck: Supple without bruits or JVD. Lungs:  Resp regular and unlabored, CTA. Heart: RRR no s3, s4, or murmurs. Abdomen: Soft, non-tender, non-distended, BS + x 4.  Extremities: No clubbing, cyanosis or edema. DP/PT/Radials 2+ and equal bilaterally.  Accessory Clinical Findings  CBC  Recent Labs  06/23/14 1523 06/24/14 0610  WBC 9.8 9.4  HGB 13.3 13.6  HCT 39.5 40.6  MCV 90.6 90.8  PLT 264 278   Basic Metabolic Panel  Recent Labs  06/23/14 1523  NA 138  K 3.6  CL 104  CO2 25  GLUCOSE 78  BUN 14  CREATININE 1.18  CALCIUM 9.5   Cardiac Enzymes  Recent Labs  06/23/14 1905 06/24/14 0035 06/24/14 0610  TROPONINI 0.20* 0.19* 0.21*   Fasting Lipid Panel  Recent Labs   06/24/14 0610  CHOL 178  HDL 36*  LDLCALC 119*  TRIG 117  CHOLHDL 4.9   Thyroid Function Tests  Recent Labs  06/23/14 1905  TSH 1.433    TELE: SR  ECG  NSR with diffuse early repol, LVH  Echocardiogram  pending   Radiology/Studies  Dg Chest 2 View  06/23/2014   CLINICAL DATA:  Chest pain  EXAM: CHEST  2 VIEW  COMPARISON:  11/12/2013  FINDINGS: Cardiomediastinal silhouette is stable. No acute infiltrate or pleural effusion. No pulmonary edema. Bony thorax is unremarkable.  IMPRESSION: No active cardiopulmonary disease.   Electronically Signed   By: Natasha MeadLiviu  Pop M.D.   On: 06/23/2014 15:41   ECHO: 06/24/2014 Study Conclusions  - Left ventricle: The cavity size was normal. Wall thickness was increased in a pattern of moderate LVH. Systolic function was normal. The estimated ejection fraction was in the range of 55% to 60%. Wall motion was normal; there were no regional wall motion abnormalities. Doppler parameters are consistent with abnormal left ventricular relaxation (grade 1 diastolic dysfunction). The E/e&' ratio is <8, suggesting normal LV filling pressure. - Left atrium: The atrium was normal in size. - Right atrium: Moderately dilated at 25 cm2.  Impressions:  - LVEF 55-60%, moderate LVH, normal wall  motion, diastolic dysfunction, normal LV filling pressure, normal LA size, moderately dilated RA.     ASSESSMENT AND PLAN  33 yo AA male with Romero/o HTN, back pain, occasional marijuana use present with chest pain after eating a meal.   1. NSTEMI  - serial trop mildly elevated but no obvious trend, 0.17 --> 0.20 --> 0.19 --> 0.21  - EKG J point elevation in lateral leads likely related to early repol  - possible cause include pericarditis/myocarditis vs ischemia vs GI related  - will discuss with MD regarding MRI vs LHC, however given resolution of CP, not unreasonable to do treadmill nuclear stress test. If negative can do trial of PPI.  Pending echo  Addendum: discussed with MD, will proceed with cath for definitive assessment, if negative will try cardiac MRI  2. Marijuana use  3. HTN  Signed, Meng, Hao PA-C Pager: [phone]   The patient was seen, examined and discussed with Hao Meng, PA-C and I agree with the above.   33 year old male with mild troponin elevation with flat change 0.17->0.20->0.19->0.21, considering patient's age and clinical presentaion this is most probably a presentation of perimyocarditis , however CAD needs to be ruled out. We will schedule a left sided cath, if negative we will perform cardiac MRI for further evaluation.   The patient also has Romero/o of untreated HTN, echo shows preserved LVEF but moderate concentric LVH. We will start lisinopril 5 mg po daily. Continue metoprolol, atorvastatin, aspirin.   Kevin Romero 06/24/2014  

## 2014-06-24 NOTE — Progress Notes (Signed)
  Echocardiogram 2D Echocardiogram has been performed.  Janalyn HarderWest, Marshell Rieger R 06/24/2014, 10:54 AM

## 2014-06-24 NOTE — Interval H&P Note (Signed)
History and Physical Interval Note:  06/24/2014 3:04 PM  Kevin Romero  has presented today for surgery, with the diagnosis of cp  The various methods of treatment have been discussed with the patient and family. After consideration of risks, benefits and other options for treatment, the patient has consented to  Procedure(s): Left Heart Cath and Cors/Grafts Angiography (N/A) as a surgical intervention .  The patient's history has been reviewed, patient examined, no change in status, stable for surgery.  I have reviewed the patient's chart and labs.  Questions were answered to the patient's satisfaction.   Cath Lab Visit (complete for each Cath Lab visit)  Clinical Evaluation Leading to the Procedure:   ACS: Yes.    Non-ACS:    Anginal Classification: CCS III  Anti-ischemic medical therapy: No Therapy  Non-Invasive Test Results: No non-invasive testing performed  Prior CABG: No previous CABG        Theron Aristaeter [facility] Herm[facility] Orthopedic And Spine HospitalJordanMD,FACC 06/24/2014 3:04 PM

## 2014-06-24 NOTE — Progress Notes (Signed)
UR Completed Emon Lance Graves-Bigelow, RN,BSN [phone]  

## 2014-06-24 NOTE — Progress Notes (Addendum)
ANTICOAGULATION CONSULT NOTE Pharmacy Consult for Heparin Indication: chest pain/ACS  No Known Allergies  Patient Measurements: Height: 6\' 3"  (190.5 cm) Weight: 227 lb 15.3 oz (103.4 kg) IBW/kg (Calculated) : 84.5  Vital Signs: Temp: 98.4 F (36.9 C) (05/16 2029) Temp Source: Oral (05/16 2029) BP: 127/80 mmHg (05/16 2029) Pulse Rate: 79 (05/16 2029)  Labs:  Recent Labs  06/23/14 1523 06/23/14 1524 06/23/14 1905 06/24/14 0035  HGB 13.3  --   --   --   HCT 39.5  --   --   --   PLT 264  --   --   --   HEPARINUNFRC  --   --   --  0.73*  CREATININE 1.18  --   --   --   TROPONINI  --  0.17* 0.20*  --     Estimated Creatinine Clearance: 114.9 mL/min (by C-G formula based on Cr of 1.18).  Assessment: 34 y.o. male with chest pain for heparin  Goal of Therapy:  Heparin level 0.3-0.7 units/ml Monitor platelets by anticoagulation protocol: Yes   Plan:  Continue Heparin at current rate.  Expect level to continue to decrease following initial bolus  Follow-up am labs.  Geannie RisenGreg Kynesha Guerin, PharmD, BCPS  06/24/2014 1:16 AM   Addendum: Level increased overnight to 0.93.  Decrease Heparin 1200 units/hr  Check heparin level in 6 hours.  Geannie RisenGreg Latima Hamza, PharmD, BCPS  06/24/2014 6:57 AM

## 2014-06-24 NOTE — Care Management Note (Signed)
Case Management Note  Patient Details  Name: Kevin Romero MRN: [id number] Date of Birth: 1980-07-15  Subjective/Objective:        Pt admitted for chest pain NStemi.             Action/Plan:  CM will continue to monitor for disposition needs if any.    Expected Discharge Date:                  Expected Discharge Plan:  Home/Self Care  In-House Referral:     Discharge planning Services  CM Consult  Post Acute Care Choice:    Choice offered to:     DME Arranged:    DME Agency:     HH Arranged:    HH Agency:     Status of Service:     Medicare Important Message Given:  No Date Medicare IM Given:    Medicare IM give by:    Date Additional Medicare IM Given:    Additional Medicare Important Message give by:     If discussed at Long Length of Stay Meetings, dates discussed:    Additional Comments:  Kevin Romero, Kevin Clarey Kaye, RN 06/24/2014, 11:43 AM

## 2014-06-24 NOTE — H&P (View-Only) (Signed)
Patient Name: Kevin Romero Date of Encounter: 06/24/2014  Primary Cardiologist: new - Dr. Delton SeeNelson   Active Problems:   NSTEMI (non-ST elevated myocardial infarction)   Essential hypertension   Chest pain    SUBJECTIVE  Chest pain resolved yesterday after starting nitro. Denies any SOB. Described the pain as burning sensation  CURRENT MEDS . aspirin EC  81 mg Oral Daily  . atorvastatin  20 mg Oral q1800  . metoprolol tartrate  12.5 mg Oral BID WC    OBJECTIVE  Filed Vitals:   06/23/14 1800 06/23/14 1837 06/23/14 2029 06/24/14 0357  BP: 146/71 139/79 127/80 141/75  Pulse: 79 66 79 67  Temp:  98.2 F (36.8 C) 98.4 F (36.9 C) 97.7 F (36.5 C)  TempSrc:  Oral Oral Oral  Resp: 19 18    Height:  6\' 3"  (1.905 m)    Weight:  227 lb 15.3 oz (103.4 kg)  225 lb 14.4 oz (102.468 kg)  SpO2: 98% 100% 97% 98%   No intake or output data in the 24 hours ending 06/24/14 0848 Filed Weights   06/23/14 1515 06/23/14 1837 06/24/14 0357  Weight: 231 lb 4 oz (104.894 kg) 227 lb 15.3 oz (103.4 kg) 225 lb 14.4 oz (102.468 kg)    PHYSICAL EXAM  General: Pleasant, NAD. Neuro: Alert and oriented X 3. Moves all extremities spontaneously. Psych: Normal affect. HEENT:  Normal  Neck: Supple without bruits or JVD. Lungs:  Resp regular and unlabored, CTA. Heart: RRR no s3, s4, or murmurs. Abdomen: Soft, non-tender, non-distended, BS + x 4.  Extremities: No clubbing, cyanosis or edema. DP/PT/Radials 2+ and equal bilaterally.  Accessory Clinical Findings  CBC  Recent Labs  06/23/14 1523 06/24/14 0610  WBC 9.8 9.4  HGB 13.3 13.6  HCT 39.5 40.6  MCV 90.6 90.8  PLT 264 278   Basic Metabolic Panel  Recent Labs  06/23/14 1523  NA 138  K 3.6  CL 104  CO2 25  GLUCOSE 78  BUN 14  CREATININE 1.18  CALCIUM 9.5   Cardiac Enzymes  Recent Labs  06/23/14 1905 06/24/14 0035 06/24/14 0610  TROPONINI 0.20* 0.19* 0.21*   Fasting Lipid Panel  Recent Labs   06/24/14 0610  CHOL 178  HDL 36*  LDLCALC 119*  TRIG 117  CHOLHDL 4.9   Thyroid Function Tests  Recent Labs  06/23/14 1905  TSH 1.433    TELE: SR  ECG  NSR with diffuse early repol, LVH  Echocardiogram  pending   Radiology/Studies  Dg Chest 2 View  06/23/2014   CLINICAL DATA:  Chest pain  EXAM: CHEST  2 VIEW  COMPARISON:  11/12/2013  FINDINGS: Cardiomediastinal silhouette is stable. No acute infiltrate or pleural effusion. No pulmonary edema. Bony thorax is unremarkable.  IMPRESSION: No active cardiopulmonary disease.   Electronically Signed   By: Natasha MeadLiviu  Pop M.D.   On: 06/23/2014 15:41   ECHO: 06/24/2014 Study Conclusions  - Left ventricle: The cavity size was normal. Wall thickness was increased in a pattern of moderate LVH. Systolic function was normal. The estimated ejection fraction was in the range of 55% to 60%. Wall motion was normal; there were no regional wall motion abnormalities. Doppler parameters are consistent with abnormal left ventricular relaxation (grade 1 diastolic dysfunction). The E/e&' ratio is <8, suggesting normal LV filling pressure. - Left atrium: The atrium was normal in size. - Right atrium: Moderately dilated at 25 cm2.  Impressions:  - LVEF 55-60%, moderate LVH, normal wall  motion, diastolic dysfunction, normal LV filling pressure, normal LA size, moderately dilated RA.     ASSESSMENT AND PLAN  34 yo AA male with Romero/o HTN, back pain, occasional marijuana use present with chest pain after eating a meal.   1. NSTEMI  - serial trop mildly elevated but no obvious trend, 0.17 --> 0.20 --> 0.19 --> 0.21  - EKG J point elevation in lateral leads likely related to early repol  - possible cause include pericarditis/myocarditis vs ischemia vs GI related  - will discuss with MD regarding MRI vs LHC, however given resolution of CP, not unreasonable to do treadmill nuclear stress test. If negative can do trial of PPI.  Pending echo  Addendum: discussed with MD, will proceed with cath for definitive assessment, if negative will try cardiac MRI  2. Marijuana use  3. HTN  Signed, Azalee CourseMeng, Hao PA-C Pager: [phone]   The patient was seen, examined and discussed with Azalee CourseHao Meng, PA-C and I agree with the above.   34 year old male with mild troponin elevation with flat change 0.17->0.20->0.19->0.21, considering patient's age and clinical presentaion this is most probably a presentation of perimyocarditis , however CAD needs to be ruled out. We will schedule a left sided cath, if negative we will perform cardiac MRI for further evaluation.   The patient also has Romero/o of untreated HTN, echo shows preserved LVEF but moderate concentric LVH. We will start lisinopril 5 mg po daily. Continue metoprolol, atorvastatin, aspirin.   Lars MassonELSON, Kevin Romero 06/24/2014

## 2014-06-25 ENCOUNTER — Encounter (HOSPITAL_COMMUNITY): Payer: Self-pay | Admitting: Cardiology

## 2014-06-25 ENCOUNTER — Inpatient Hospital Stay (HOSPITAL_COMMUNITY): Payer: 59

## 2014-06-25 DIAGNOSIS — R072 Precordial pain: Secondary | ICD-10-CM | POA: Diagnosis not present

## 2014-06-25 DIAGNOSIS — I10 Essential (primary) hypertension: Secondary | ICD-10-CM | POA: Diagnosis not present

## 2014-06-25 DIAGNOSIS — R079 Chest pain, unspecified: Secondary | ICD-10-CM

## 2014-06-25 DIAGNOSIS — I309 Acute pericarditis, unspecified: Secondary | ICD-10-CM

## 2014-06-25 DIAGNOSIS — I214 Non-ST elevation (NSTEMI) myocardial infarction: Secondary | ICD-10-CM | POA: Diagnosis not present

## 2014-06-25 DIAGNOSIS — R7989 Other specified abnormal findings of blood chemistry: Secondary | ICD-10-CM

## 2014-06-25 DIAGNOSIS — E785 Hyperlipidemia, unspecified: Secondary | ICD-10-CM

## 2014-06-25 DIAGNOSIS — R778 Other specified abnormalities of plasma proteins: Secondary | ICD-10-CM

## 2014-06-25 HISTORY — DX: Acute pericarditis, unspecified: I30.9

## 2014-06-25 MED ORDER — IBUPROFEN 400 MG PO TABS: 400.0000 mg | ORAL_TABLET | Freq: Three times a day (TID) | ORAL | Status: AC

## 2014-06-25 MED ORDER — LORAZEPAM 2 MG/ML IJ SOLN
1.0000 mg | Freq: Four times a day (QID) | INTRAMUSCULAR | Status: DC | PRN
  Administered 2014-06-25: 1 mg via INTRAVENOUS

## 2014-06-25 MED ORDER — IBUPROFEN 200 MG PO TABS
400.0000 mg | ORAL_TABLET | Freq: Three times a day (TID) | ORAL | Status: DC
  Administered 2014-06-25: 400 mg via ORAL
  Filled 2014-06-25: qty 2

## 2014-06-25 MED ORDER — LORAZEPAM 2 MG/ML IJ SOLN
INTRAMUSCULAR | Status: AC
  Administered 2014-06-25: 1 mg via INTRAVENOUS
  Filled 2014-06-25: qty 1

## 2014-06-25 MED ORDER — LISINOPRIL 5 MG PO TABS
5.0000 mg | ORAL_TABLET | Freq: Every day | ORAL | Status: DC
  Administered 2014-06-25: 5 mg via ORAL
  Filled 2014-06-25: qty 1

## 2014-06-25 MED ORDER — COLCHICINE 0.6 MG PO TABS
0.6000 mg | ORAL_TABLET | Freq: Two times a day (BID) | ORAL | Status: DC
  Administered 2014-06-25: 0.6 mg via ORAL
  Filled 2014-06-25: qty 1

## 2014-06-25 MED ORDER — COLCHICINE 0.6 MG PO TABS: 0.6000 mg | ORAL_TABLET | Freq: Two times a day (BID) | ORAL | Status: DC

## 2014-06-25 MED ORDER — GADOBENATE DIMEGLUMINE 529 MG/ML IV SOLN
35.0000 mL | Freq: Once | INTRAVENOUS | Status: AC
  Administered 2014-06-25: 32 mL via INTRAVENOUS

## 2014-06-25 MED ORDER — DIAZEPAM 5 MG PO TABS
5.0000 mg | ORAL_TABLET | Freq: Once | ORAL | Status: AC
  Administered 2014-06-25: 5 mg via ORAL
  Filled 2014-06-25: qty 1

## 2014-06-25 MED ORDER — ATORVASTATIN CALCIUM 10 MG PO TABS: 10.0000 mg | ORAL_TABLET | Freq: Every day | ORAL | Status: DC

## 2014-06-25 MED ORDER — METOPROLOL TARTRATE 25 MG PO TABS: 12.5000 mg | ORAL_TABLET | Freq: Two times a day (BID) | ORAL | Status: DC

## 2014-06-25 MED ORDER — LISINOPRIL 5 MG PO TABS: 5.0000 mg | ORAL_TABLET | Freq: Every day | ORAL | Status: DC

## 2014-06-25 MED FILL — Heparin Sodium (Porcine) 2 Unit/ML in Sodium Chloride 0.9%: INTRAMUSCULAR | Qty: 1000 | Status: AC

## 2014-06-25 MED FILL — Lidocaine HCl Local Preservative Free (PF) Inj 1%: INTRAMUSCULAR | Qty: 30 | Status: AC

## 2014-06-25 NOTE — Progress Notes (Signed)
Patient Name: Kevin Romero Date of Encounter: 06/25/2014  Primary Cardiologist: new - Dr. Delton SeeNelson   Active Problems:   NSTEMI (non-ST elevated myocardial infarction)   Essential hypertension   Chest pain    SUBJECTIVE  No further chest pain. No SOB  CURRENT MEDS . aspirin EC  81 mg Oral Daily  . atorvastatin  20 mg Oral q1800  . diazepam  5 mg Oral Once  . metoprolol tartrate  12.5 mg Oral BID WC  . sodium chloride  3 mL Intravenous Q12H    OBJECTIVE  Filed Vitals:   06/24/14 1704 06/24/14 1734 06/24/14 2007 06/25/14 0504  BP: 144/89 120/85 145/84 133/79  Pulse:   60 68  Temp:   98.9 F (37.2 C) 98.2 F (36.8 C)  TempSrc:   Oral Oral  Resp:      Height:      Weight:    222 lb (100.699 kg)  SpO2:   100% 98%   No intake or output data in the 24 hours ending 06/25/14 0922 Filed Weights   06/23/14 1837 06/24/14 0357 06/25/14 0504  Weight: 227 lb 15.3 oz (103.4 kg) 225 lb 14.4 oz (102.468 kg) 222 lb (100.699 kg)    PHYSICAL EXAM  General: Pleasant, NAD. Neuro: Alert and oriented X 3. Moves all extremities spontaneously. Psych: Normal affect. HEENT:  Normal  Neck: Supple without bruits or JVD. Lungs:  Resp regular and unlabored, CTA. Heart: RRR no s3, s4, or murmurs. R radial cath site stable.  Abdomen: Soft, non-tender, non-distended, BS + x 4.  Extremities: No clubbing, cyanosis or edema. DP/PT/Radials 2+ and equal bilaterally.  Accessory Clinical Findings  CBC  Recent Labs  06/23/14 1523 06/24/14 0610  WBC 9.8 9.4  HGB 13.3 13.6  HCT 39.5 40.6  MCV 90.6 90.8  PLT 264 278   Basic Metabolic Panel  Recent Labs  06/23/14 1523  NA 138  K 3.6  CL 104  CO2 25  GLUCOSE 78  BUN 14  CREATININE 1.18  CALCIUM 9.5   Cardiac Enzymes  Recent Labs  06/23/14 1905 06/24/14 0035 06/24/14 0610  TROPONINI 0.20* 0.19* 0.21*   Fasting Lipid Panel  Recent Labs  06/24/14 0610  CHOL 178  HDL 36*  LDLCALC 119*  TRIG 117  CHOLHDL 4.9    Thyroid Function Tests  Recent Labs  06/23/14 1905  TSH 1.433    TELE: SR  ECG  NSR with diffuse early repol, LVH  Echocardiogram  Echo 55-60%, grade 1 diastolic dysfunction   Radiology/Studies  Dg Chest 2 View  06/23/2014   CLINICAL DATA:  Chest pain  EXAM: CHEST  2 VIEW  COMPARISON:  11/12/2013  FINDINGS: Cardiomediastinal silhouette is stable. No acute infiltrate or pleural effusion. No pulmonary edema. Bony thorax is unremarkable.  IMPRESSION: No active cardiopulmonary disease.   Electronically Signed   By: Natasha MeadLiviu  Pop M.D.   On: 06/23/2014 15:41   ECHO: 06/24/2014 Study Conclusions  - Left ventricle: The cavity size was normal. Wall thickness was increased in a pattern of moderate LVH. Systolic function was normal. The estimated ejection fraction was in the range of 55% to 60%. Wall motion was normal; there were no regional wall motion abnormalities. Doppler parameters are consistent with abnormal left ventricular relaxation (grade 1 diastolic dysfunction). The E/e&' ratio is <8, suggesting normal LV filling pressure. - Left atrium: The atrium was normal in size. - Right atrium: Moderately dilated at 25 cm2.  Impressions:  - LVEF 55-60%, moderate  LVH, normal wall motion, diastolic dysfunction, normal LV filling pressure, normal LA size, moderately dilated RA.     ASSESSMENT AND PLAN  34 yo AA male with h/o HTN, back pain, occasional marijuana use present with chest pain after eating a meal.   1. NSTEMI  - serial trop mildly elevated but no obvious trend, 0.17 --> 0.20 --> 0.19 --> 0.21  - EKG J point elevation in lateral leads likely related to early repol  - possible cause include pericarditis/myocarditis vs ischemia vs GI related  - normal cath yesterday, pending cardiac MRI by Dr. Delton SeeNelson, if normal, plan to discharge with possible trial of PPI  2. Marijuana use  3. HTN 4. Hyperlipidemia: LDL 119, on statin  Hassan BucklerSigned, Meng, Hao  PA-C Pager: [phone]  The patient was seen, examined and discussed with Azalee CourseHao Meng, PA-C and I agree with the above.   34 year old male with mild troponin elevation with flat change 0.17->0.20->0.19->0.21,  Cardiac cath was normal, cardiac MRI showed a mild case of acute myopericarditis at the patient's a[ex.  There is also moderate concentric LVH.  We will discharge on  lisinopril 5 mg po daily, metoprolol 12.5 mg po BID, atorvastatin 10 mg po daily, ibuprofen 400 mg po TID x 2 weeks and colchicine 0,6 mg po bid x 3 months, we will follow in the clinic.  Lars MassonELSON, Jakyiah Briones H 06/25/2014

## 2014-06-25 NOTE — Progress Notes (Signed)
Pt discharged via W/C, condition stable, accompanied by family. 

## 2014-06-25 NOTE — Discharge Instructions (Signed)
No driving for 24 hours. No lifting over 5 lbs for 1 week. No sexual activity for 1 week. You may return to work on 06/26/2014. Keep procedure site clean & dry. If you notice increased pain, swelling, bleeding or pus, call/return!  You may shower, but no soaking baths/hot tubs/pools for 1 week.

## 2014-06-25 NOTE — Progress Notes (Signed)
Notified by MRI patient having trouble tolerating MRI. Cardiologist on-call paged and made aware. MRI also letting Cardiologist  know. New orders received for PO valium. MRI stated their nurse will give. Will continue to monitor. .Marland Kitchen

## 2014-06-25 NOTE — Discharge Summary (Signed)
Discharge Summary   Patient ID: Kevin Romero,  MRN: [id number], DOB/AGE: 1980/11/29 34 y.o.  Admit date: 06/23/2014 Discharge date: 06/25/2014  Primary Care Provider: No PCP Per Patient Primary Cardiologist: Dr. Delton See  Discharge Diagnoses Principal Problem:   Acute myopericarditis Active Problems:   Essential hypertension   Chest pain   Elevated troponin   Hyperlipidemia   Allergies No Known Allergies  Procedures  Echocardiogram 06/24/2014 LV EF: 55% -  60%  ------------------------------------------------------------------- Indications:   Chest pain 786.51.  ------------------------------------------------------------------- History:  PMH:  Acute myocardial infarction. Risk factors: Hypertension.  ------------------------------------------------------------------- Study Conclusions  - Left ventricle: The cavity size was normal. Wall thickness was increased in a pattern of moderate LVH. Systolic function was normal. The estimated ejection fraction was in the range of 55% to 60%. Wall motion was normal; there were no regional wall motion abnormalities. Doppler parameters are consistent with abnormal left ventricular relaxation (grade 1 diastolic dysfunction). The E/e&' ratio is <8, suggesting normal LV filling pressure. - Left atrium: The atrium was normal in size. - Right atrium: Moderately dilated at 25 cm2.  Impressions:  - LVEF 55-60%, moderate LVH, normal wall motion, diastolic dysfunction, normal LV filling pressure, normal LA size, moderately dilated RA.    Cardiac catheterization 06/24/2014 Dominance: Co-dominant   Left Main  The vessel , is normal in caliber is angiographically normal.     Left Anterior Descending  The vessel , is normal in caliber is angiographically normal.     Left Circumflex  The vessel , is normal in caliber is angiographically normal.     Right Coronary Artery  The vessel , is normal  in caliber is angiographically normal     Left Ventricle The left ventricular size is normal. The left ventricular systolic function is normal. The left ventricular ejection fraction is 55-65% by visual estimate. There are no wall motion abnormalities in the left ventricle.    Mitral Valve There is no mitral valve regurgitation.      Cardiac MRI 06/25/2014  1. Normal left ventricular size, with moderate concentric hypertrophy and normal systolic function (LVEF =54%). There are no regional wall motion abnormalities.  LVEDD: 51 mm  LVESD: 27 mm  Basal anteroseptal wall: 14 mm  Basal inferolateral wall: 14 mm  LVEDV: 132 ml  LVESV: 52 mm  SV: 80 ml  2. Normal right ventricular size, thickness and systolic function (RVEF = 49%) with no regional wall motion abnormalities.  RVEDV: 164 ml  RVESV: 84 ml  SV: 80 ml  3. Normal biatrial size.  4. No significant valvular abnormality.  5. Normal caliber of the aortic root, ascending aorta and pulmonary artery.  6. There is a small area of diffuse late gadolinium enhancement in the the distal anterior, inferior and septal walls and mild enhancement of the adjacent pericardium.  IMPRESSION: 1. Normal left ventricular size, with moderate concentric hypertrophy and normal systolic function (LVEF =54%). There are no regional wall motion abnormalities.  2. Normal right ventricular size, thickness and systolic function (RVEF = 49%) with no regional wall motion abnormalities.  3. There is a small area of diffuse late gadolinium enhancement in the the distal anterior, inferior and septal walls and mild enhancement of the adjacent pericardium.  Collectively, these findings are consistent with a mild case of acute myopericarditis.    Hospital Course  The patient is a 34 year old ophthalmic male with PMH of HTN who presented to [facility] on 06/23/2014 with chest pain that started after  eating a meal. He took a high-dose aspirin and the pain eased off for awhile. He woke up pain-free but it after being active during the day, the chest pain returned. [facility] and was subsequently admitted to cardiology service.  His serial troponin overnight was mildly elevated. It was felt the patient likely have possible pericarditis versus ischemia versus GI related disorder. Given his risk factors, it was decided for the patient to undergo cardiac catheterization. Cardiac cath obtained on 06/24/2014 showed normal coronaries with EF 55-65%. Echocardiogram obtained on the same day showed normal EF with grade 1 diastolic dysfunction, no pericardial effusion. Due to the suspicion for acute mild carditis, cardiac MRI was obtained on the following day which showed EF 54%, no regional wall motion abnormality, no significant valvular abnormality, normal caliber of aortic root, ascending aorta and pulmonary artery. There is small area of diffuse late gadolinium enhancement in the distal anterior, inferior and septal wall consistent with mild case of acute myopericarditis. He was started on 400 mg every 8 hours of ibuprofen and twice a day dosing of colchicine. He is deemed stable for discharge from cardiology perspective. I have arranged follow-up with Dr. Lindaann SloughNelson's clinic. He will continue ibuprofen for 2 weeks and continue colchicine for 3 months. Of note, during this admission, his lipid panel came back shows elevated LDL and he was started on low-dose statin medication. His home amlodipine has been discontinued, instead he was placed on metoprolol 12.5 mg twice a day and lisinopril 5 mg daily to control blood pressure.   Discharge Vitals Blood pressure 133/79, pulse 68, temperature 98.2 F (36.8 C), temperature source Oral, resp. rate 16, height 6\' 3"  (1.905 m), weight 222 lb (100.699 kg), SpO2 98 %.  Filed Weights   06/23/14 1837 06/24/14 0357 06/25/14 0504  Weight: 227 lb  15.3 oz (103.4 kg) 225 lb 14.4 oz (102.468 kg) 222 lb (100.699 kg)    Labs  CBC  Recent Labs  06/23/14 1523 06/24/14 0610  WBC 9.8 9.4  HGB 13.3 13.6  HCT 39.5 40.6  MCV 90.6 90.8  PLT 264 278   Basic Metabolic Panel  Recent Labs  06/23/14 1523  NA 138  K 3.6  CL 104  CO2 25  GLUCOSE 78  BUN 14  CREATININE 1.18  CALCIUM 9.5   Cardiac Enzymes  Recent Labs  06/23/14 1905 06/24/14 0035 06/24/14 0610  TROPONINI 0.20* 0.19* 0.21*   Hemoglobin A1C  Recent Labs  06/23/14 1905  HGBA1C 5.7*   Fasting Lipid Panel  Recent Labs  06/24/14 0610  CHOL 178  HDL 36*  LDLCALC 119*  TRIG 117  CHOLHDL 4.9   Thyroid Function Tests  Recent Labs  06/23/14 1905  TSH 1.433    Disposition  Pt is being discharged home today in good condition.  Follow-up Plans & Appointments      Follow-up Information    Follow up with Tereso NewcomerScott Weaver, PA-C On 07/23/2014.   Specialties:  Physician Assistant, Radiology, Interventional Cardiology   Why:  3:20pm   Contact information:   [address] [place] [address] [phone]       Discharge Medications    Medication List    STOP taking these medications        amLODipine 5 MG tablet  Commonly known as:  NORVASC     aspirin 325 MG tablet     cyclobenzaprine 10 MG tablet  Commonly known as:  FLEXERIL  HYDROcodone-acetaminophen 5-325 MG per tablet  Commonly known as:  NORCO     naproxen sodium 220 MG tablet  Commonly known as:  ANAPROX     predniSONE 20 MG tablet  Commonly known as:  DELTASONE      TAKE these medications        atorvastatin 10 MG tablet  Commonly known as:  LIPITOR  Take 1 tablet (10 mg total) by mouth daily at 6 PM.     colchicine 0.6 MG tablet  Take 1 tablet (0.6 mg total) by mouth 2 (two) times daily.     ibuprofen 400 MG tablet  Commonly known as:  ADVIL,MOTRIN  Take 1 tablet (400 mg total) by mouth 3 (three) times daily with meals.     lisinopril  5 MG tablet  Commonly known as:  PRINIVIL,ZESTRIL  Take 1 tablet (5 mg total) by mouth daily.     metoprolol tartrate 25 MG tablet  Commonly known as:  LOPRESSOR  Take 0.5 tablets (12.5 mg total) by mouth 2 (two) times daily with a meal.     multivitamin tablet  Take 1 tablet by mouth daily.          Duration of Discharge Encounter   Greater than 30 minutes including physician time.  Ramond DialSigned, Delories Mauri PA-C Pager: [phone] 06/25/2014, 2:17 PM

## 2014-06-25 NOTE — Progress Notes (Signed)
   06/25/14 1400  Clinical Encounter Type  Visited With Patient  Visit Type Initial  Advance Directives (For Healthcare)  Does patient have an advance directive? No  Would patient like information on creating an advanced directive? Yes - Educational materials given   Chaplain was referred to patient via spiritual care consult. Consult indicated patient would like to create of update an advanced directive. When chaplain arrived, patient was readying himself for discharge. Patient does have a copy of an advanced directive and intends on completing it. Chaplain provided patient with some instructions on how to complete it and what to do once it is completed outside of the hospital.  Cranston NeighborStrother, Jovi Alvizo R, Chaplain  2:19 PM

## 2014-06-30 ENCOUNTER — Telehealth: Payer: Self-pay | Admitting: Physician Assistant

## 2014-06-30 ENCOUNTER — Telehealth: Payer: Self-pay

## 2014-06-30 NOTE — Telephone Encounter (Signed)
Prior auth sent for Colchicine 0.6mg  to [facility] via Cover My Meds.

## 2014-06-30 NOTE — Telephone Encounter (Signed)
Patient contacted the after hour service as his insurance require additional information and physician to fill out form in order to approve cochicine. I have contacted the pharmacy and request them to fax information to Dr. Lindaann SloughNelson's office for signature. During the mean time, he will use ibuprofen for treatment of perimyocarditis.   Ramond DialSigned, Jamisen Hawes PA Pager: [phone]

## 2014-07-02 ENCOUNTER — Telehealth: Payer: Self-pay

## 2014-07-02 ENCOUNTER — Other Ambulatory Visit: Payer: Self-pay

## 2014-07-02 MED ORDER — COLCHICINE 0.6 MG PO CAPS: 0.6000 mg | ORAL_CAPSULE | Freq: Two times a day (BID) | ORAL | Status: DC

## 2014-07-02 NOTE — Telephone Encounter (Signed)
Per note from Dr. Delton SeeNelson, generic Colchicine 0.6mg  caps is acceptable to use for patient. Sent to [facility] [place]

## 2014-07-02 NOTE — Telephone Encounter (Signed)
Prior auth for cholcrys [facility]. He must try Mitigare , which is Colchicine in capsule form. Will forward a note to Dr. Delton SeeNelson. Patient informed as well.

## 2014-07-02 NOTE — Telephone Encounter (Signed)
New rx for Mitigare(Colchicine)0.6mg  caps sent to [facility] in [place], [place].

## 2014-07-09 ENCOUNTER — Encounter (HOSPITAL_COMMUNITY): Payer: Self-pay | Admitting: Emergency Medicine

## 2014-07-09 ENCOUNTER — Emergency Department (INDEPENDENT_AMBULATORY_CARE_PROVIDER_SITE_OTHER)
Admission: EM | Admit: 2014-07-09 | Discharge: 2014-07-09 | Disposition: A | Discharge status: Home / Self Care | Payer: 59 | Source: Home / Self Care | Attending: Family Medicine | Admitting: Family Medicine

## 2014-07-09 DIAGNOSIS — J0100 Acute maxillary sinusitis, unspecified: Secondary | ICD-10-CM

## 2014-07-09 DIAGNOSIS — R0981 Nasal congestion: Secondary | ICD-10-CM

## 2014-07-09 LAB — POCT RAPID STREP A: STREPTOCOCCUS, GROUP A SCREEN (DIRECT): NEGATIVE

## 2014-07-09 MED ORDER — AMOXICILLIN 500 MG PO CAPS: 500.0000 mg | ORAL_CAPSULE | Freq: Two times a day (BID) | ORAL | Status: DC

## 2014-07-09 NOTE — ED Provider Notes (Signed)
CSN: [id number]     Arrival date & time 07/09/14  1647 History   First MD Initiated Contact with Patient 07/09/14 1753     Chief Complaint  Patient presents with  . Sinusitis   (Consider location/radiation/quality/duration/timing/severity/associated sxs/prior Treatment) HPI Comments: Mr. Freida Busmanllen presents with maxillary sinus pain, nasal congestion and post nasal drip. He has been using Dayquil and Nyquil without much relief. He denies fevers or chills. No cough. He was discharged from the hospital a week ago with pericarditis and since that times reports worsening nasal congestion. The drainage is green/brown and "bloody" at times.   The history is provided by the patient.    Past Medical History  Diagnosis Date  . Back pain   . Hypertension   . Hyperlipidemia   . Acute myopericarditis 06/25/2014    confirmed by MRI, negative cath 06/24/2014   Past Surgical History  Procedure Laterality Date  . Cardiac catheterization N/A 06/24/2014    Procedure: LEFT HEART CATH AND CORS;  Surgeon: Peter M SwazilandJordan, MD;  [facility];  Service: Cardiovascular;  Laterality: N/A;   Family History  Problem Relation Age of Onset  . Hypertension Mother    History  Substance Use Topics  . Smoking status: Former Smoker    Quit date: 03/05/2014  . Smokeless tobacco: Not on file  . Alcohol Use: Yes     Comment: social    Review of Systems  All other systems reviewed and are negative.   Allergies  Review of patient's allergies indicates no known allergies.  Home Medications   Prior to Admission medications   Medication Sig Start Date End Date Taking? Authorizing Provider  amoxicillin (AMOXIL) 500 MG capsule Take 1 capsule (500 mg total) by mouth 2 (two) times daily. 07/09/14   Riki SheerMichelle G Maraya Gwilliam, PA-C  atorvastatin (LIPITOR) 10 MG tablet Take 1 tablet (10 mg total) by mouth daily at 6 PM. 06/25/14   Azalee CourseHao Meng, PA  Colchicine (MITIGARE) 0.6 MG CAPS Take 0.6 mg by mouth 2 (two) times daily.  07/02/14   Lars MassonKatarina H Nelson, MD  ibuprofen (ADVIL,MOTRIN) 400 MG tablet Take 1 tablet (400 mg total) by mouth 3 (three) times daily with meals. 06/25/14 07/09/14  Azalee CourseHao Meng, PA  lisinopril (PRINIVIL,ZESTRIL) 5 MG tablet Take 1 tablet (5 mg total) by mouth daily. 06/25/14   Azalee CourseHao Meng, PA  metoprolol tartrate (LOPRESSOR) 25 MG tablet Take 0.5 tablets (12.5 mg total) by mouth 2 (two) times daily with a meal. 06/25/14   Azalee CourseHao Meng, PA  Multiple Vitamin (MULTIVITAMIN) tablet Take 1 tablet by mouth daily.    Historical Provider, MD   BP 130/79 mmHg  Pulse 68  Temp(Src) 98.7 F (37.1 C) (Oral)  Resp 12  SpO2 96% Physical Exam  Constitutional: He is oriented to person, place, and time. He appears well-developed and well-nourished. No distress.  HENT:  Head: Normocephalic and atraumatic.  Right Ear: External ear normal.  Left Ear: External ear normal.  Mild oropharynx injection. No exudate. Nasal turbinates with swelling and erythema. Clear drainage on exam. Tenderness to palpation along the maxillary sinus  Neck: Normal range of motion. Neck supple.  Cardiovascular: Normal rate and regular rhythm.   Pulmonary/Chest: Effort normal and breath sounds normal.  Lymphadenopathy:    He has no cervical adenopathy.  Neurological: He is alert and oriented to person, place, and time.  Skin: Skin is warm and dry. He is not diaphoretic.  Psychiatric: His behavior is normal.  Nursing note and  vitals reviewed.   ED Course  Procedures (including critical care time) Labs Review Labs Reviewed  POCT RAPID STREP A    Imaging Review No results found.   MDM   1. Acute maxillary sinusitis, recurrence not specified   2. Nasal congestion   Given duration and presentation, treat with antibiotics, NSAID's and nettie pot. Avoid decongestants in the setting of heart issues and HTN. F/U if worsens.     Riki Sheer, PA-C 07/09/14 [phone]

## 2014-07-09 NOTE — Discharge Instructions (Signed)
Sinusitis Sinusitis is redness, soreness, and puffiness (inflammation) of the air pockets in the bones of your face (sinuses). The redness, soreness, and puffiness can cause air and mucus to get trapped in your sinuses. This can allow germs to grow and cause an infection.  HOME CARE   Drink enough fluids to keep your pee (urine) clear or pale yellow.  Use a humidifier in your home.  Run a hot shower to create steam in the bathroom. Sit in the bathroom with the door closed. Breathe in the steam 3-4 times a day.  Put a warm, moist washcloth on your face 3-4 times a day, or as told by your doctor.  Use salt water sprays (saline sprays) to wet the thick fluid in your nose. This can help the sinuses drain.  Only take medicine as told by your doctor. GET HELP RIGHT AWAY IF:   Your pain gets worse.  You have very bad headaches.  You are sick to your stomach (nauseous).  You throw up (vomit).  You are very sleepy (drowsy) all the time.  Your face is puffy (swollen).  Your vision changes.  You have a stiff neck.  You have trouble breathing. MAKE SURE YOU:   Understand these instructions.  Will watch your condition.  Will get help right away if you are not doing well or get worse. Document Released: 07/13/2007 Document Revised: 10/19/2011 Document Reviewed: 08/30/2011 [facility] Patient Information 2015 [employer] [place]. This information is not intended to replace advice given to you by your health care provider. Make sure you discuss any questions you have with your health care provider.   Take all of antibiotics. Avoid decongestants right now, anything that ends in "ephedrine". Try using Mucinex with continued Claritin. Ok to use Benadryl at night for sleep. Try a nettie pot for sinus drainage. Take Aleve 1 2x a day for sinus congestion/inflammation.

## 2014-07-09 NOTE — ED Notes (Signed)
C/o sinus infection States he has a headache, stuffy nose, throat drainage, coughing States he used dayquil and clartin as tx

## 2014-07-12 ENCOUNTER — Telehealth (HOSPITAL_COMMUNITY): Payer: Self-pay | Admitting: Family Medicine

## 2014-07-12 LAB — CULTURE, GROUP A STREP

## 2014-07-12 NOTE — ED Notes (Signed)
Called patient about his strep culture.  Left a voicemail asking to call me back.   Rodolph BongEvan S Aliviya Schoeller, MD 07/12/14 [phone]

## 2014-07-14 NOTE — ED Notes (Signed)
Called and spoke w another member of family who advised this Clinical research associatewriter that the is taking his antibiotics

## 2014-07-17 ENCOUNTER — Encounter: Payer: Self-pay | Admitting: Urgent Care

## 2014-07-17 ENCOUNTER — Ambulatory Visit (INDEPENDENT_AMBULATORY_CARE_PROVIDER_SITE_OTHER): Payer: 59 | Admitting: Urgent Care

## 2014-07-17 VITALS — BP 110/70 | HR 61 | Temp 98.5°F | Resp 16 | Ht 73.0 in | Wt 220.0 lb

## 2014-07-17 DIAGNOSIS — Z8679 Personal history of other diseases of the circulatory system: Secondary | ICD-10-CM

## 2014-07-17 DIAGNOSIS — I10 Essential (primary) hypertension: Secondary | ICD-10-CM

## 2014-07-17 DIAGNOSIS — E785 Hyperlipidemia, unspecified: Secondary | ICD-10-CM

## 2014-07-17 MED ORDER — INDOMETHACIN 50 MG PO CAPS: 50.0000 mg | ORAL_CAPSULE | Freq: Three times a day (TID) | ORAL | Status: DC

## 2014-07-17 NOTE — Progress Notes (Addendum)
    MRN: [id number] DOB: 1980-02-17  Subjective:   Kevin Romero is a 34 y.o. male with pmh of myopericarditis, HTN, HL presenting for chief complaint of Hospitalization Follow-up; Referral; and Establish Care  Patient was seen 06/25/2014 diagnosed with the above, underwent complete cardiac work-up and discharged with rx for colchicine and asked to follow up with Cardiology. Patient states that he was to follow up with Kevin Romero but was then told that he could not complete this unless he established care and obtained a referral to cardiology which is why he is here today. Of note, patient is not currently taking colchicine, was unable to afford this and would like rx for an alternative more affordable medicine. Reports that he was supposed to be on colchicine for 90 days. Denies chest pain, heart racing, shob, diaphoresis, neck pain, jaw pain, limb pain, n/v, abdominal pain. Since being discharged he returned to ED for sinusitis and rx'ed amoxicillin but has not had any other complications. Denies any other aggravating or relieving factors, no other questions or concerns.  Kevin Romero has a current medication list which includes the following prescription(s): amoxicillin, atorvastatin, lisinopril, metoprolol tartrate, multivitamin, vitamin c, and colchicine. He has No Known Allergies.  Kevin Romero  has a past medical history of Back pain; Hypertension; Hyperlipidemia; and Acute myopericarditis (06/25/2014). Also  has past surgical history that includes Cardiac catheterization (N/A, 06/24/2014).  ROS As in subjective.  Objective:   Vitals: BP 110/70 mmHg  Pulse 61  Temp(Src) 98.5 F (36.9 C)  Resp 16  Ht 6\' 1"  (1.854 m)  Wt 220 lb (99.791 kg)  BMI 29.03 kg/m2  SpO2 98%  Physical Exam  Constitutional: He is oriented to person, place, and time. He appears well-developed and well-nourished.  HENT:  Mouth/Throat: Oropharynx is clear and moist.  Eyes: Conjunctivae are normal. Pupils are equal,  round, and reactive to light. No scleral icterus.  Neck: Normal range of motion. Neck supple. No thyromegaly present.  Cardiovascular: Normal rate, regular rhythm and intact distal pulses.  Exam reveals no gallop and no friction rub (not heard with patient leaning forward as well).   No murmur heard. Pulmonary/Chest: No respiratory distress. He has no wheezes. He has no rales. He exhibits no tenderness.  Neurological: He is alert and oriented to person, place, and time.  Skin: Skin is warm and dry.  Psychiatric: He has a normal mood and affect.   Assessment and Plan :   1. History of pericarditis 2. Essential hypertension 3. Hyperlipidemia - Switched patient to indomethacin TID until cardiology follow up. Counseled patient on diagnosis of pericarditis and worrisome signs and symptoms of cardiac event. Patient verbalized understanding and will call 911 or report to ED if these symptoms develop. - Continue medications for HTN and HL - Follow up in 4 weeks  [name], PA-C [facility] [phone] 07/17/2014 4:44 PM

## 2014-07-17 NOTE — Patient Instructions (Signed)
Pericarditis Pericarditis is swelling (inflammation) of the pericardium. The pericardium is a thin, double-layered, fluid-filled tissue sac that surrounds the heart. The purpose of the pericardium is to contain the heart in the chest cavity and keep the heart from overexpanding. Different types of pericarditis can occur, such as:  Acute pericarditis. Inflammation can develop suddenly in acute pericarditis.  Chronic pericarditis. Inflammation develops gradually and is long-lasting in chronic pericarditis.  Constrictive pericarditis. In this type of pericarditis, the layers of the pericardium stiffen and develop scar tissue. The scar tissue thickens and sticks together. This makes it difficult for the heart to pump and work as it normally does. CAUSES  Pericarditis can be caused from different conditions, such as:  A bacterial, fungal or viral infection.  After a heart attack (myocardial infarction).  After open-heart surgery (coronary bypass graft surgery).  Auto-immune conditions such as lupus, rheumatoid arthritis or scleroderma.  Kidney failure.  Low thyroid condition (hypothyroidism).  Cancer from another part of the body that has spread (metastasized) to the pericardium.  Chest injury or trauma.  After radiation treatment.  Certain medicines. SYMPTOMS  Symptoms of pericarditis can include:  Chest pain. Chest pain symptoms may increase when laying down and may be relieved when sitting up and leaning forward.  A chronic, dry cough.  Heart palpitations. These may feel like rapid, fluttering or pounding heart beats.  Chest pain may be worse when swallowing.  Dizziness or fainting.  Tiredness, fatigue or lethargy.  Fever. DIAGNOSIS  Pericarditis is diagnosed by the following:  A physical exam. A heart sound called a pericardial friction rub may be heard when your caregiver listens to your heart.  Blood work. Blood may be drawn to check for an infection and to look at  your blood chemistry.  Electrocardiography. During electrocardiography your heart's electrical activity is monitored and recorded with a tracing on paper (electrocardiogram [ECG]).  Echocardiography.  Computed tomography (CT).  Magnetic resonance image (MRI). TREATMENT  To treat pericarditis, it is important to know the cause of it. The cause of pericarditis determines the treatment.   If the cause of pericarditis is due to an infection, treatment is based on the type of infection. If an infection is suspected in the pericardial fluid, a procedure called a pericardial fluid culture and biopsy may be done. This takes a sample of the pericardial fluid. The sample is sent to a lab which runs tests on the pericardial fluid to check for an infection.  If the autoimmune disease is the cause, treatment of the autoimmune condition will help improve the pericarditis.  If the cause of pericarditis is not known, anti-inflammatory medicines may be used to help decrease the inflammation.  Surgery may be needed. The following are types of surgeries or procedures that may be done to treat pericarditis:  Pericardial window. A pericardial window makes a cut (incision) into the pericardial sac. This allows excess fluid in the pericardium to drain.  Pericardiocentesis. A pericardiocentesis is also known as a pericardial tap. This procedure uses a needle that is guided by X-ray to drain (aspirate) excess fluid from the pericardium.  Pericardiectomy. A pericardiectomy removes part or all of the pericardium. HOME CARE INSTRUCTIONS   Do not smoke. If you smoke, quit. Your caregiver can help you quit smoking.  Maintain a healthy weight.  Follow an exercise program as told by your caregiver.  If you drink alcohol, do so in moderation.  Eat a heart healthy diet. A registered dietician can help you learn about   healthy food choices.  Keep a list of all your medicines with you at all times. Include the name,  dose, how often it is taken and how it is taken. SEEK IMMEDIATE MEDICAL CARE IF:   You have chest pain or feelings of chest pressure.  You have sweating (diaphoresis) when at rest.  You have irregular heartbeats (palpitations).  You have rapid, racing heart beats.  You have unexplained fainting episodes.  You feel sick to your stomach (nausea) or vomiting without cause.  You have unexplained weakness. If you develop any of the symptoms which originally made you seek care, call for local emergency medical help. Do not drive yourself to the hospital. Document Released: 07/20/2000 Document Revised: 04/18/2011 Document Reviewed: 01/26/2011 [facility] Patient Information 2015 [place], [place]. This information is not intended to replace advice given to you by your health care provider. Make sure you discuss any questions you have with your health care provider.    Hypertension Hypertension, commonly called high blood pressure, is when the force of blood pumping through your arteries is too strong. Your arteries are the blood vessels that carry blood from your heart throughout your body. A blood pressure reading consists of a higher number over a lower number, such as 110/72. The higher number (systolic) is the pressure inside your arteries when your heart pumps. The lower number (diastolic) is the pressure inside your arteries when your heart relaxes. Ideally you want your blood pressure below 120/80. Hypertension forces your heart to work harder to pump blood. Your arteries may become narrow or stiff. Having hypertension puts you at risk for heart disease, stroke, and other problems.  RISK FACTORS Some risk factors for high blood pressure are controllable. Others are not.  Risk factors you cannot control include:   Race. You may be at higher risk if you are African American.  Age. Risk increases with age.  Gender. Men are at higher risk than women before age 10 years. After age 64, women  are at higher risk than men. Risk factors you can control include:  Not getting enough exercise or physical activity.  Being overweight.  Getting too much fat, sugar, calories, or salt in your diet.  Drinking too much alcohol. SIGNS AND SYMPTOMS Hypertension does not usually cause signs or symptoms. Extremely high blood pressure (hypertensive crisis) may cause headache, anxiety, shortness of breath, and nosebleed. DIAGNOSIS  To check if you have hypertension, your health care provider will measure your blood pressure while you are seated, with your arm held at the level of your heart. It should be measured at least twice using the same arm. Certain conditions can cause a difference in blood pressure between your right and left arms. A blood pressure reading that is higher than normal on one occasion does not mean that you need treatment. If one blood pressure reading is high, ask your health care provider about having it checked again. TREATMENT  Treating high blood pressure includes making lifestyle changes and possibly taking medicine. Living a healthy lifestyle can help lower high blood pressure. You may need to change some of your habits. Lifestyle changes may include:  Following the DASH diet. This diet is high in fruits, vegetables, and whole grains. It is low in salt, red meat, and added sugars.  Getting at least 2 hours of brisk physical activity every week.  Losing weight if necessary.  Not smoking.  Limiting alcoholic beverages.  Learning ways to reduce stress. If lifestyle changes are not enough to get  your blood pressure under control, your health care provider may prescribe medicine. You may need to take more than one. Work closely with your health care provider to understand the risks and benefits. HOME CARE INSTRUCTIONS  Have your blood pressure rechecked as directed by your health care provider.   Take medicines only as directed by your health care provider.  Follow the directions carefully. Blood pressure medicines must be taken as prescribed. The medicine does not work as well when you skip doses. Skipping doses also puts you at risk for problems.   Do not smoke.   Monitor your blood pressure at home as directed by your health care provider. SEEK MEDICAL CARE IF:   You think you are having a reaction to medicines taken.  You have recurrent headaches or feel dizzy.  You have swelling in your ankles.  You have trouble with your vision. SEEK IMMEDIATE MEDICAL CARE IF:  You develop a severe headache or confusion.  You have unusual weakness, numbness, or feel faint.  You have severe chest or abdominal pain.  You vomit repeatedly.  You have trouble breathing. MAKE SURE YOU:   Understand these instructions.  Will watch your condition.  Will get help right away if you are not doing well or get worse. Document Released: 01/24/2005 Document Revised: 06/10/2013 Document Reviewed: 11/16/2012 [facility] Patient Information 2015 [place], [place]. This information is not intended to replace advice given to you by your health care provider. Make sure you discuss any questions you have with your health care provider.

## 2014-07-21 ENCOUNTER — Encounter: Payer: Self-pay | Admitting: *Deleted

## 2014-07-22 NOTE — Progress Notes (Signed)
Cardiology Office Note   Date:  07/23/2014   ID:  Kevin Romero, DOB 1980-02-13, MRN [id number]  PCP:  No PCP Per Patient  Cardiologist:  Dr. Tobias Alexander     Chief Complaint  Patient presents with  . Hospitalization Follow-up    Admx for chest pain  . Pericarditis     History of Present Illness: Kevin Romero is a 34 y.o. male with a hx of HTN.  Admitted 5/16-5/18 with chest pain.  Troponins were mildly elevated in a flat trend.  LHC demonstrated normal coronary arteries.  Echo demonstrated normal EF with normal wall motion, mod LVH. Cardiac MRI demonstrated a small area of diffuse late gadolinium enhancement in the the distal anterior, inferior and septal walls and mild enhancement of the adjacent pericardium.  This was felt to be consistent myopericarditis.  He was started on NSAID therapy (Ibuprofen) and Colchicine.  Of note, LDL was high and he was placed on statin Rx.  He returns for FU.    Since discharge, he has continued to have diffuse pains in his chest and arms. This is different from his presenting symptoms with pericarditis. He denies significant dyspnea. He denies orthopnea, PND or edema. He denies syncope. He has had one episode of lightheadedness. He does feel fatigued. He notes taking frequent naps. He has not been taking colchicine. The medication was too expensive and he could not afford to get it. He saw someone in urgent care recently who placed him on Indocin.   Recent Labs  06/23/14 1524 06/23/14 1528 06/23/14 1905 06/24/14 0035 06/24/14 0610  TROPIPOC  --  0.11*  --   --   --   TROPONINI 0.17*  --  0.20* 0.19* 0.21*      Studies/Reports Reviewed Today:  LHC 06/24/14  Normal coronary anatomy  Normal LV function  Echo 06/24/14 - Moderate LVH. EF 55%to 60%. Wall motion was normal; Grade 1 diastolic dysfunction. The E/e&' ratio is <8, suggesting normal LV fillingpressure. - Left atrium: The atrium was normal in size. - Right atrium:  Moderately dilated at 25 cm2. Impressions:- LVEF 55-60%, moderate LVH, normal wall motion, diastolicdysfunction, normal LV filling pressure, normal LA size,moderately dilated RA.  Cardiac MRI 06/25/14 IMPRESSION: 1. Normal left ventricular size, with moderate concentric hypertrophy and normal systolic function (LVEF =54%). There are no regional wall motion abnormalities. 2. Normal right ventricular size, thickness and systolic function (RVEF = 49%) with no regional wall motion abnormalities. 3. There is a small area of diffuse late gadolinium enhancement in the the distal anterior, inferior and septal walls and mild enhancement of the adjacent pericardium. Collectively, these findings are consistent with a mild case of acute myopericarditis.   Past Medical History  Diagnosis Date  . Back pain   . Hypertension   . Hyperlipidemia   . Acute myopericarditis 06/25/2014    confirmed by MRI, negative cath 06/24/2014    Past Surgical History  Procedure Laterality Date  . Cardiac catheterization N/A 06/24/2014    Procedure: LEFT HEART CATH AND CORS;  Surgeon: Peter M Swaziland, MD;  Location: [facility] INVASIVE CV LAB;  Service: Cardiovascular;  Laterality: N/A;     Current Outpatient Prescriptions  Medication Sig Dispense Refill  . atorvastatin (LIPITOR) 10 MG tablet Take 1 tablet (10 mg total) by mouth daily at 6 PM. 30 tablet 5  . indomethacin (INDOCIN) 50 MG capsule Take 1 capsule (50 mg total) by mouth 3 (three) times daily with meals. 60 capsule 1  .  lisinopril (PRINIVIL,ZESTRIL) 5 MG tablet Take 1 tablet (5 mg total) by mouth daily. 30 tablet 5  . metoprolol tartrate (LOPRESSOR) 25 MG tablet Take 0.5 tablets (12.5 mg total) by mouth 2 (two) times daily with a meal. 30 tablet 5  . vitamin C (ASCORBIC ACID) 500 MG tablet Take 500 mg by mouth daily.     No current facility-administered medications for this visit.    Allergies:   Review of patient's allergies indicates no known allergies.     Social History:  The patient  reports that he quit smoking about 4 months ago. He does not have any smokeless tobacco history on file. He reports that he drinks alcohol. He reports that he uses illicit drugs (Marijuana) about 7 times per week.   Family History:  The patient's family history includes Heart failure in his maternal grandfather; Hypertension in his maternal grandfather, maternal grandmother, and mother; Stroke in his maternal grandmother.    ROS:   Please see the history of present illness.   Review of Systems  Constitution: Positive for malaise/fatigue.  Respiratory: Positive for cough.   Musculoskeletal: Positive for back pain.      PHYSICAL EXAM: VS:  BP 110/50 mmHg  Pulse 79  Ht 6\' 1"  (1.854 m)  Wt 223 lb (101.152 kg)  BMI 29.43 kg/m2    Wt Readings from Last 3 Encounters:  07/23/14 223 lb (101.152 kg)  07/17/14 220 lb (99.791 kg)  06/25/14 222 lb (100.699 kg)     GEN: Well nourished, well developed, in no acute distress HEENT: normal Neck: no JVD, no masses Cardiac:  Normal S1/S2, RRR; no murmur, no rubs or gallops, no edema   Respiratory:  clear to auscultation bilaterally, no wheezing, rhonchi or rales. GI: soft, nontender, nondistended, + BS MS: no deformity or atrophy Skin: warm and dry  Neuro:  CNs II-XII intact, Strength and sensation are intact Psych: Normal affect   EKG:  EKG is ordered today.  It demonstrates:   NSR, HR 79, normal axis, nonspecific ST-T wave changes, LVH, no change from prior tracing   Recent Labs: 06/23/2014: BUN 14; Creatinine, Ser 1.18; Potassium 3.6; Sodium 138; TSH 1.433 06/24/2014: Hemoglobin 13.6; Platelets 278    Lipid Panel    Component Value Date/Time   CHOL 178 06/24/2014 0610   TRIG 117 06/24/2014 0610   HDL 36* 06/24/2014 0610   CHOLHDL 4.9 06/24/2014 0610   VLDL 23 06/24/2014 0610   LDLCALC 119* 06/24/2014 0610      ASSESSMENT AND PLAN:  Acute myopericarditis:  He was unable to afford  colchicine. He continues to have diffuse atypical chest pains. Some of this sounds like myalgias. I will try to get him approved for colchicine.  He will need to remain on this for 3 mos.  He does not need to take Indocin. He can take ibuprofen 400 mg 3 times a day with food. After taking colchicine for a week, he can stop this.  Essential hypertension:  Blood pressure controlled. Continue lisinopril. He is fatigued. This is likely from beta blocker therapy. Decrease metoprolol to once daily 2 days, then stop.  Hyperlipidemia:  Question if he is having myalgias from Lipitor. Stop Lipitor for now.    Current medicines are reviewed at length with the patient today.  Concerns regarding medicines are as outlined above.  The following changes have been made:   Discontinued Medications   AMOXICILLIN (AMOXIL) 500 MG CAPSULE    Take 1 capsule (500 mg total) by  mouth 2 (two) times daily.   ATORVASTATIN (LIPITOR) 10 MG TABLET    Take 1 tablet (10 mg total) by mouth daily at 6 PM.   INDOMETHACIN (INDOCIN) 50 MG CAPSULE    Take 1 capsule (50 mg total) by mouth 3 (three) times daily with meals.   Modified Medications   No medications on file   New Prescriptions   No medications on file     Labs/ tests ordered today include:   No orders of the defined types were placed in this encounter.     Disposition:   FU with me in 3-4 weeks.    Signed, Brynda Rim, MHS 07/23/2014 4:09 PM    [facility] [address] [place], [place], [place]  [address] Phone: [phone]; Fax: [phone]

## 2014-07-23 ENCOUNTER — Encounter: Payer: Self-pay | Admitting: Physician Assistant

## 2014-07-23 ENCOUNTER — Ambulatory Visit (INDEPENDENT_AMBULATORY_CARE_PROVIDER_SITE_OTHER): Payer: 59 | Admitting: Physician Assistant

## 2014-07-23 VITALS — BP 110/50 | HR 79 | Ht 73.0 in | Wt 223.0 lb

## 2014-07-23 DIAGNOSIS — E785 Hyperlipidemia, unspecified: Secondary | ICD-10-CM | POA: Diagnosis not present

## 2014-07-23 DIAGNOSIS — I309 Acute pericarditis, unspecified: Secondary | ICD-10-CM | POA: Diagnosis not present

## 2014-07-23 DIAGNOSIS — I10 Essential (primary) hypertension: Secondary | ICD-10-CM | POA: Diagnosis not present

## 2014-07-23 MED ORDER — COLCHICINE 0.6 MG PO TABS: 0.6000 mg | ORAL_TABLET | Freq: Two times a day (BID) | ORAL | Status: DC

## 2014-07-23 MED ORDER — METOPROLOL TARTRATE 25 MG PO TABS: 12.5000 mg | ORAL_TABLET | ORAL | Status: DC

## 2014-07-23 NOTE — Patient Instructions (Signed)
Medication Instructions:  1. STOP LIPITOR 2. STOP INDOCIN 3. DECREASE METOPROLOL TO ONCE A DAY FOR 2 DAYS THEN STOP 4. START COLCHICINE 0.6 MG 1 TAB TWICE DAILY 5. TAKE IBUPROFEN 400 MG THREE TIMES DAILY; ONCE YOU START COLCHICINE YOU WILL TAKE IBUPROFEN FOR 1 MORE WEEK THEN STOP IBUPROFEN  Labwork: TODAY BMET  Testing/Procedures: NONE  Follow-Up: [employer], [facility] 08/13/14 @ 2:40  Any Other Special Instructions Will Be Listed Below (If Applicable).

## 2014-07-24 ENCOUNTER — Telehealth: Payer: Self-pay | Admitting: *Deleted

## 2014-07-24 LAB — BASIC METABOLIC PANEL
BUN: 15 mg/dL (ref 6–23)
CALCIUM: 9.7 mg/dL (ref 8.4–10.5)
CO2: 31 mEq/L (ref 19–32)
CREATININE: 1.25 mg/dL (ref 0.40–1.50)
Chloride: 103 mEq/L (ref 96–112)
GFR: 84.62 mL/min (ref >60.00)
Glucose, Bld: 88 mg/dL (ref 70–99)
Potassium: 3.9 mEq/L (ref 3.5–5.1)
SODIUM: 138 meq/L (ref 135–145)

## 2014-07-24 NOTE — Telephone Encounter (Signed)
Pt notified of lab results with verbal understanding. pt asked me if I heard back from the Prior Auth for the Colchicine for his pericarditis. I said I have not but should in the next few days. pt said ok and thank you.

## 2014-08-04 ENCOUNTER — Telehealth: Payer: Self-pay

## 2014-08-04 NOTE — Telephone Encounter (Signed)
Kevin Romero, did you start a PA for this?  Insurance will pay for Colchicine 0.6 mg capsules, not tabs. It is called [employer]. I discussed this with the patient last month.

## 2014-08-08 ENCOUNTER — Telehealth: Payer: Self-pay | Admitting: Physician Assistant

## 2014-08-08 ENCOUNTER — Other Ambulatory Visit: Payer: Self-pay

## 2014-08-08 ENCOUNTER — Other Ambulatory Visit: Payer: Self-pay | Admitting: *Deleted

## 2014-08-08 DIAGNOSIS — I309 Acute pericarditis, unspecified: Secondary | ICD-10-CM

## 2014-08-08 MED ORDER — COLCHICINE 0.6 MG PO CAPS: 0.6000 mg | ORAL_CAPSULE | Freq: Two times a day (BID) | ORAL | Status: DC

## 2014-08-08 MED ORDER — COLCHICINE 0.6 MG PO CAPS
0.6000 mg | ORAL_CAPSULE | Freq: Two times a day (BID) | ORAL | Status: DC
Start: 2014-08-08 — End: 2014-08-08

## 2014-08-08 MED ORDER — COLCHICINE 0.6 MG PO CAPS: 1.0000 | ORAL_CAPSULE | Freq: Two times a day (BID) | ORAL | Status: DC

## 2014-08-08 NOTE — Telephone Encounter (Signed)
NEW Rx SENT IN FOR THE COLCHICINE 0.6 MG CAPSULE EVERY 12 HOURS .

## 2014-08-08 NOTE — Telephone Encounter (Signed)
Rx sent in recently for Colchicine has instructions to take at 12 pm and 4 pm.  He can take it about every 12 hours (Twice daily). Tereso NewcomerScott Ciera Beckum, PA-C   08/08/2014 1:59 PM

## 2014-08-08 NOTE — Telephone Encounter (Signed)
Scott, I did not send in this Rx; Bonita QuinLinda did. Insurance will pay for Colchicine 0.6 mg capsules, not tabs. It is called Mitigare. I will fix it though.

## 2014-08-08 NOTE — Telephone Encounter (Signed)
Thanks Tereso NewcomerScott Antoni Stefan, PA-C   08/08/2014 5:27 PM

## 2014-08-08 NOTE — Telephone Encounter (Signed)
Odette HornsLinda Reiland, RN sending in for new medication

## 2014-08-13 ENCOUNTER — Ambulatory Visit: Payer: 59 | Admitting: Physician Assistant

## 2014-09-16 NOTE — Progress Notes (Signed)
Cardiology Office Note   Date:  09/17/2014   ID:  Kevin Romero, DOB 1980/11/11, MRN [id number]  PCP:  No PCP Per Patient  Cardiologist:  Dr. Tobias Alexander     Chief Complaint  Patient presents with  . Follow-up    Myopericarditis     History of Present Illness: Kevin Romero is a 34 y.o. male with a hx of HTN.  Admitted 5/16 with chest pain.  Troponins were mildly elevated in a flat trend.  LHC demonstrated normal coronary arteries.  Echo demonstrated normal EF with normal wall motion, mod LVH. Cardiac MRI demonstrated a small area of diffuse late gadolinium enhancement in the the distal anterior, inferior and septal walls and mild enhancement of the adjacent pericardium.  This was felt to be consistent myopericarditis.  He was started on NSAID therapy (Ibuprofen) and Colchicine.  Of note, LDL was high and he was placed on statin Rx.    I saw him in follow-up in 07/23/14. He had not started on colchicine due to expense of the drug. We tried to get him approved for this. He was fatigued from beta blocker. I took him off of this medication. He had muscle aches.  I stopped his Lipitor.  He returns for follow-up.  He still had difficulty getting colchicine. He found out today that he's no longer insured by [place] [employer]. He was able to get colchicine from his uncle. He's been taking colchicine for a couple weeks now. He feels better. He longer has significant chest discomfort. Denies dyspnea, orthopnea, PND or edema. Denies syncope.   Studies/Reports Reviewed Today:  LHC 06/24/14  Normal coronary anatomy  Normal LV function  Echo 06/24/14 Moderate LVH, EF 55-60%, normal wall motion, grade 1 diastolic dysfunction, moderate RAE  Cardiac MRI 06/25/14 IMPRESSION: 1. Normal left ventricular size, with moderate concentric hypertrophy and normal systolic function (LVEF =54%). There are no regional wall motion abnormalities. 2. Normal right ventricular size, thickness and  systolic function (RVEF = 49%) with no regional wall motion abnormalities. 3. There is a small area of diffuse late gadolinium enhancement in the the distal anterior, inferior and septal walls and mild enhancement of the adjacent pericardium. Collectively, these findings are consistent with a mild case of acute myopericarditis.   Past Medical History  Diagnosis Date  . Back pain   . Hypertension   . Hyperlipidemia   . Acute myopericarditis 06/25/2014    confirmed by MRI, negative cath 06/24/2014    Past Surgical History  Procedure Laterality Date  . Cardiac catheterization N/A 06/24/2014    Procedure: LEFT HEART CATH AND CORS;  Surgeon: Peter M Swaziland, MD;  Location: [facility] INVASIVE CV LAB;  Service: Cardiovascular;  Laterality: N/A;     Current Outpatient Prescriptions  Medication Sig Dispense Refill  . Colchicine 0.6 MG CAPS Take 0.6 mg by mouth 2 (two) times daily. EVERY 12 HOURS 60 capsule 2  . lisinopril (PRINIVIL,ZESTRIL) 5 MG tablet Take 1 tablet (5 mg total) by mouth daily. 30 tablet 5  . metoprolol tartrate (LOPRESSOR) 25 MG tablet Take 0.5 tablets (12.5 mg total) by mouth as directed. DECREASE TO ONCE A DAY X 2 DAYS THEN STOP    . vitamin C (ASCORBIC ACID) 500 MG tablet Take 500 mg by mouth daily.     No current facility-administered medications for this visit.    Allergies:   Review of patient's allergies indicates no known allergies.    Social History:  The patient  reports that  he quit smoking about 6 months ago. He does not have any smokeless tobacco history on file. He reports that he drinks alcohol. He reports that he uses illicit drugs (Marijuana) about 7 times per week.   Family History:  The patient's family history includes Heart failure in his maternal grandfather; Hypertension in his maternal grandfather, maternal grandmother, and mother; Stroke in his maternal grandmother.    ROS:   Please see the history of present illness.   Review of Systems    Musculoskeletal: Positive for back pain.  All other systems reviewed and are negative.     PHYSICAL EXAM: VS:  There were no vitals taken for this visit.    Wt Readings from Last 3 Encounters:  07/23/14 223 lb (101.152 kg)  07/17/14 220 lb (99.791 kg)  06/25/14 222 lb (100.699 kg)     GEN: Well nourished, well developed, in no acute distress HEENT: normal Neck: no JVD, no masses Cardiac:  Normal S1/S2, RRR; no murmur, no rubs or gallops, no edema   Respiratory:  clear to auscultation bilaterally, no wheezing, rhonchi or rales. GI: soft, nontender, nondistended, + BS MS: no deformity or atrophy Skin: warm and dry  Neuro:  CNs II-XII intact, Strength and sensation are intact Psych: Normal affect   EKG:  EKG is ordered today.  It demonstrates:   NSR, HR 88, LVH, inf and ant-lat TWI, similar to prior tracings.  Recent Labs: 06/23/2014: TSH 1.433 06/24/2014: Hemoglobin 13.6; Platelets 278 07/23/2014: BUN 15; Creatinine, Ser 1.25; Potassium 3.9; Sodium 138    Lipid Panel    Component Value Date/Time   CHOL 178 06/24/2014 0610   TRIG 117 06/24/2014 0610   HDL 36* 06/24/2014 0610   CHOLHDL 4.9 06/24/2014 0610   VLDL 23 06/24/2014 0610   LDLCALC 119* 06/24/2014 0610      ASSESSMENT AND PLAN:  Acute myopericarditis:  Symptoms are improved on colchicine. Unfortunately, we've had significant issues with obtaining this medication for him. The drug is too expensive. He no longer has insurance. I will refer him to [facility]. Hopefully they're pharmacy can provide him colchicine for the next 3 months. I will see him back in 3 months. We will likely take him off of colchicine at that point.  Essential hypertension:  Controlled.  Hyperlipidemia:  Myalgias are improved off of Lipitor. He is fairly low risk at this point and had a negative cardiac catheterization recently. Continue with diet and exercise.    Current medicines are reviewed at  length with the patient today.  Concerns regarding medicines are as outlined above.  The following changes have been made:   Discontinued Medications   No medications on file   Modified Medications   No medications on file   New Prescriptions   No medications on file     Labs/ tests ordered today include:   No orders of the defined types were placed in this encounter.     Disposition:   FU with me in 3 mos.    Signed, Brynda Rim, MHS 09/17/2014 1:45 PM    [facility] [address] [place], [place], [place]  [address] Phone: [phone]; Fax: [phone]

## 2014-09-17 ENCOUNTER — Encounter: Payer: Self-pay | Admitting: Physician Assistant

## 2014-09-17 ENCOUNTER — Ambulatory Visit (INDEPENDENT_AMBULATORY_CARE_PROVIDER_SITE_OTHER): Payer: Self-pay | Admitting: Physician Assistant

## 2014-09-17 VITALS — BP 124/60 | HR 88 | Ht 74.0 in | Wt 214.1 lb

## 2014-09-17 DIAGNOSIS — E785 Hyperlipidemia, unspecified: Secondary | ICD-10-CM

## 2014-09-17 DIAGNOSIS — I10 Essential (primary) hypertension: Secondary | ICD-10-CM

## 2014-09-17 DIAGNOSIS — I309 Acute pericarditis, unspecified: Secondary | ICD-10-CM

## 2014-09-17 MED ORDER — COLCHICINE 0.6 MG PO CAPS: 0.6000 mg | ORAL_CAPSULE | Freq: Two times a day (BID) | ORAL | Status: DC

## 2014-09-17 NOTE — Patient Instructions (Addendum)
Medication Instructions:  AN RX FOR COLCHICINE 0.6 MG TWICE DAILY; RX SENT TO [facility]  Labwork: NONE  Testing/Procedures: NONE  Follow-Up: 3 MONTHS WITH SCOTT WEAVER, PAC  Any Other Special Instructions Will Be Listed Below (If Applicable). YOU ARE BEING REFERRED OVER TO [facility] TO EST WITH A PRIMARY CARE PHYSICIAN

## 2014-12-17 NOTE — Progress Notes (Signed)
Cardiology Office Note   Date:  12/18/2014   ID:  Kevin Romero, DOB 12/12/1980, MRN [id number]   Patient Care Team: No Pcp Per Patient as PCP - General (General Practice) Lars Masson, MD as Consulting Physician (Cardiology)    Chief Complaint  Patient presents with  . Follow-up    Pericarditis     History of Present Illness: Korvin Yaakov Saindon is a 34 y.o. male with a hx of HTN. Admitted 5/16 with chest pain. Troponins were mildly elevated in a flat trend. LHC demonstrated normal coronary arteries. Echo demonstrated normal EF with normal wall motion, mod LVH. Cardiac MRI demonstrated a small area of diffuse late gadolinium enhancement in the the distal anterior, inferior and septal walls and mild enhancement of the adjacent pericardium. This was felt to be consistent myopericarditis. He was started on NSAID therapy (Ibuprofen) and Colchicine. Of note, LDL was high and he was placed on statin Rx. Last seen 09/17/14. He was having difficulty getting colchicine secondary to cost. He had stopped Lipitor secondary to myalgias.we were able to get his colchicine through the [facility]. He returns for follow-up.  He is doing well.  He denies any significant chest pain. He has occasional atypical chest symptoms.  No significant dyspnea, orthopnea, PND, edema.  No syncope.  He does note some recent epistaxis.  This is infrequent and resolves fairly quickly.     Studies/Reports Reviewed Today:  LHC 06/24/14 Normal coronary anatomy;  Normal LV function  Echo 06/24/14 Moderate LVH, EF 55-60%, normal wall motion, grade 1 diastolic dysfunction, moderate RAE  Cardiac MRI 06/25/14 IMPRESSION: 1. Normal left ventricular size, mod concentric LVH and normal LVF (LVEF =54%). No RWMA 2. Normal right ventricular size, thickness and systolic function (RVEF = 49%) no RWMA 3. There is a small area of diffuse late gadolinium enhancement in the  the distal anterior, inferior and septal walls and mild enhancement of the adjacent pericardium. Collectively, these findings are consistent with a mild case of acute myopericarditis.   Past Medical History  Diagnosis Date  . Back pain   . Hypertension   . Hyperlipidemia   . Acute myopericarditis 06/25/2014    confirmed by MRI, negative cath 06/24/2014    Past Surgical History  Procedure Laterality Date  . Cardiac catheterization N/A 06/24/2014    Procedure: LEFT HEART CATH AND CORS;  Surgeon: Peter M Swaziland, MD;  Location: [facility] INVASIVE CV LAB;  Service: Cardiovascular;  Laterality: N/A;     Current Outpatient Prescriptions  Medication Sig Dispense Refill  . Colchicine 0.6 MG CAPS Take 0.6 mg by mouth 2 (two) times daily. EVERY 12 HOURS 60 capsule 3  . lisinopril (PRINIVIL,ZESTRIL) 5 MG tablet Take 1 tablet (5 mg total) by mouth daily. 30 tablet 5  . metoprolol tartrate (LOPRESSOR) 25 MG tablet Take 0.5 tablets (12.5 mg total) by mouth as directed. DECREASE TO ONCE A DAY X 2 DAYS THEN STOP (Patient not taking: Reported on 12/18/2014)     No current facility-administered medications for this visit.    Allergies:   Review of patient's allergies indicates no known allergies.    Social History:   Social History   Social History  . Marital Status: Single    Spouse Name: N/A  . Number of Children: N/A  . Years of Education: N/A   Social History Main Topics  . Smoking status: Former Smoker    Quit date: 03/05/2014  . Smokeless tobacco: None  .  Alcohol Use: Yes     Comment: social  . Drug Use: 7.00 per week    Special: Marijuana  . Sexual Activity: Not Asked   Other Topics Concern  . None   Social History Narrative     Family History:   Family History  Problem Relation Age of Onset  . Hypertension Mother   . Heart failure Maternal Grandfather   . Hypertension Maternal Grandfather   . Stroke Maternal Grandmother   . Hypertension Maternal Grandmother       ROS:     Please see the history of present illness.   Review of Systems  HENT: Positive for nosebleeds.   All other systems reviewed and are negative.     PHYSICAL EXAM: VS:  BP 118/62 mmHg  Pulse 61  Ht 6\' 2"  (1.88 m)  Wt 224 lb 6.4 oz (101.787 kg)  BMI 28.80 kg/m2    Wt Readings from Last 3 Encounters:  12/18/14 224 lb 6.4 oz (101.787 kg)  09/17/14 214 lb 1.9 oz (97.124 kg)  07/23/14 223 lb (101.152 kg)     GEN: Well nourished, well developed, in no acute distress HEENT: normal Neck: no JVD, no carotid bruits, no masses Cardiac:  Normal S1/S2, RRR; no murmur ,  no rubs or gallops, no edema   Respiratory:  clear to auscultation bilaterally, no wheezing, rhonchi or rales. GI: soft, nontender, nondistended, + BS MS: no deformity or atrophy Skin: warm and dry  Neuro:  CNs II-XII intact, Strength and sensation are intact Psych: Normal affect   EKG:  EKG is ordered today.  It demonstrates:   NSR, HR 61, normal axis, j point elevation, QTc 386 ms   Recent Labs: 06/23/2014: TSH 1.433 06/24/2014: Hemoglobin 13.6; Platelets 278 07/23/2014: BUN 15; Creatinine, Ser 1.25; Potassium 3.9; Sodium 138    Lipid Panel    Component Value Date/Time   CHOL 178 06/24/2014 0610   TRIG 117 06/24/2014 0610   HDL 36* 06/24/2014 0610   CHOLHDL 4.9 06/24/2014 0610   VLDL 23 06/24/2014 0610   LDLCALC 119* 06/24/2014 0610      ASSESSMENT AND PLAN:  1. Myopericarditis:  Seems to have resolved.  He has completed 3 mos of Colchicine.  He never had an effusion.  He can stop Colchicine once his Rx runs out.  FU sooner if symptoms recur.   2. HTN:  Controlled.   3. Hyperlipidemia:  [facility]  He had myalgias with Atorvastatin.  Continue diet and exercise.        Medication Changes: Current medicines are reviewed at length with the patient today.  Concerns regarding medicines are as outlined above.  The following changes have been made:   Discontinued  Medications   No medications on file   Modified Medications   No medications on file   New Prescriptions   No medications on file   Labs/ tests ordered today include:   Orders Placed This Encounter  Procedures  . EKG 12-Lead     Disposition:    FU with me 6 mos.     Signed, Brynda RimScott Afton Mikelson, PA-C, MHS 12/18/2014 10:50 AM    [facility] [address], [place], [place]  [id number] Phone: [phone]; Fax: [phone]

## 2014-12-18 ENCOUNTER — Ambulatory Visit (INDEPENDENT_AMBULATORY_CARE_PROVIDER_SITE_OTHER): Payer: Self-pay | Admitting: Physician Assistant

## 2014-12-18 ENCOUNTER — Encounter: Payer: Self-pay | Admitting: Physician Assistant

## 2014-12-18 ENCOUNTER — Other Ambulatory Visit: Payer: Self-pay | Admitting: Cardiology

## 2014-12-18 VITALS — BP 118/62 | HR 61 | Ht 74.0 in | Wt 224.4 lb

## 2014-12-18 DIAGNOSIS — I10 Essential (primary) hypertension: Secondary | ICD-10-CM

## 2014-12-18 DIAGNOSIS — I309 Acute pericarditis, unspecified: Secondary | ICD-10-CM

## 2014-12-18 DIAGNOSIS — E785 Hyperlipidemia, unspecified: Secondary | ICD-10-CM

## 2014-12-18 MED ORDER — LISINOPRIL 5 MG PO TABS: 5.0000 mg | ORAL_TABLET | Freq: Every day | ORAL | Status: DC

## 2014-12-18 NOTE — Patient Instructions (Signed)
Medication Instructions:  Doreatha MartinFinish out your prescriptions of Colchicine. Once you are out of refills, you can stop the medicine.  Labwork: None   Testing/Procedures: None   Follow-Up: Tereso NewcomerScott Wilhelmine Krogstad, PA-C in 6 mos or sooner if symptoms return.  Any Other Special Instructions Will Be Listed Below (If Applicable).

## 2015-08-31 ENCOUNTER — Ambulatory Visit (HOSPITAL_COMMUNITY)
Admission: EM | Admit: 2015-08-31 | Discharge: 2015-08-31 | Disposition: A | Discharge status: Home / Self Care | Payer: Self-pay | Attending: Emergency Medicine | Admitting: Emergency Medicine

## 2015-08-31 ENCOUNTER — Encounter (HOSPITAL_COMMUNITY): Payer: Self-pay | Admitting: Emergency Medicine

## 2015-08-31 DIAGNOSIS — M545 Low back pain, unspecified: Secondary | ICD-10-CM

## 2015-08-31 MED ORDER — PREDNISONE 50 MG PO TABS: ORAL_TABLET | ORAL | 0 refills | Status: DC

## 2015-08-31 MED ORDER — HYDROCODONE-ACETAMINOPHEN 5-325 MG PO TABS: 1.0000 | ORAL_TABLET | Freq: Four times a day (QID) | ORAL | 0 refills | Status: DC | PRN

## 2015-08-31 MED ORDER — CYCLOBENZAPRINE HCL 10 MG PO TABS: 10.0000 mg | ORAL_TABLET | Freq: Three times a day (TID) | ORAL | 0 refills | Status: DC | PRN
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
## 2015-08-31 NOTE — Discharge Instructions (Signed)
You have inflammation and spasm in the muscles of your lower back. Take prednisone daily for 5 days. Use the Flexeril 3 times a day as needed for back pain. I recommend using this primarily at bedtime as it'll make you sleepy. Apply warm compresses as often as you can. Use the hydrocodone every 4-6 hours as needed for severe pain. Do not drive while taking this medicine. If this is not improving over the next 1-2 weeks, please follow-up with Dr. Charlann Boxer, an orthopedic specialist.

## 2015-08-31 NOTE — ED Notes (Signed)
Patient initially wanted a medication provided here.  offered to check with DR.Piedad Climes, but patient decided to leave prior to getting this order.

## 2015-11-06 ENCOUNTER — Other Ambulatory Visit: Payer: Self-pay | Admitting: Physician Assistant

## 2015-11-06 DIAGNOSIS — I309 Acute pericarditis, unspecified: Secondary | ICD-10-CM

## 2016-02-17 ENCOUNTER — Other Ambulatory Visit: Payer: Self-pay | Admitting: Cardiology

## 2017-09-06 ENCOUNTER — Ambulatory Visit: Payer: Self-pay | Admitting: Physician Assistant

## 2018-01-19 ENCOUNTER — Encounter (HOSPITAL_COMMUNITY): Payer: Self-pay | Admitting: Emergency Medicine

## 2018-01-19 ENCOUNTER — Ambulatory Visit (HOSPITAL_COMMUNITY)
Admission: EM | Admit: 2018-01-19 | Discharge: 2018-01-19 | Disposition: A | Discharge status: Home / Self Care | Payer: Self-pay | Attending: Emergency Medicine | Admitting: Emergency Medicine

## 2018-01-19 ENCOUNTER — Ambulatory Visit (INDEPENDENT_AMBULATORY_CARE_PROVIDER_SITE_OTHER): Payer: Self-pay

## 2018-01-19 DIAGNOSIS — M25572 Pain in left ankle and joints of left foot: Secondary | ICD-10-CM

## 2018-01-19 DIAGNOSIS — S93492A Sprain of other ligament of left ankle, initial encounter: Secondary | ICD-10-CM | POA: Insufficient documentation

## 2018-01-19 NOTE — ED Triage Notes (Signed)
[facility] after twisting it last night while jogging.  Patient states he has tried ibuprofen, rest and elevation without relief.

## 2018-01-19 NOTE — ED Notes (Signed)
Patient able to ambulate independently with limp in ASO

## 2018-01-19 NOTE — ED Provider Notes (Signed)
[facility]    CSN: [id number] Arrival date & time: 01/19/18  1715     History   Chief Complaint Chief Complaint  Patient presents with  . Ankle Pain    HPI Kevin Romero is a 37 y.o. male.   The history is provided by the patient.  Ankle Pain  Location:  Ankle Time since incident:  1 day Injury: yes   Mechanism of injury comment:  Patient was running down a driveway covered with wet leaves.  He injured his ankle.  He is unsure of the exact mechanism. Ankle location:  L ankle Pain details:    Quality:  Throbbing and pressure   Radiates to:  Does not radiate   Severity:  Mild   Onset quality:  Sudden   Duration:  1 day   Timing:  Constant   Progression:  Unchanged Chronicity:  New Dislocation: no   Foreign body present:  No foreign bodies Prior injury to area:  No Relieved by:  Rest Worsened by:  Bearing weight Associated symptoms: decreased ROM and swelling   Associated symptoms: no back pain, no fever, no numbness and no tingling     Past Medical History:  Diagnosis Date  . Acute myopericarditis 06/25/2014   confirmed by MRI, negative cath 06/24/2014  . Back pain   . Hyperlipidemia   . Hypertension     Patient Active Problem List   Diagnosis Date Noted  . Acute myopericarditis 06/25/2014  . Elevated troponin 06/25/2014  . Hyperlipidemia 06/25/2014  . Essential hypertension 06/23/2014  . Chest pain 06/23/2014    Past Surgical History:  Procedure Laterality Date  . CARDIAC CATHETERIZATION N/A 06/24/2014   Procedure: LEFT HEART CATH AND CORS;  Surgeon: Peter M SwazilandJordan, MD;  [facility];  Service: Cardiovascular;  Laterality: N/A;       Home Medications    Prior to Admission medications   Medication Sig Start Date End Date Taking? Authorizing Provider  colchicine 0.6 MG tablet TAKE 1 TABLET BY MOUTH 2 TIMES DAILY EVERY 12 HOURS 11/06/15   Tereso NewcomerWeaver, Scott T, PA-C  cyclobenzaprine (FLEXERIL) 10 MG tablet Take 1 tablet  (10 mg total) by mouth 3 (three) times daily as needed for muscle spasms. 08/31/15   Charm RingsHonig, Erin J, MD  HYDROcodone-acetaminophen (NORCO) 5-325 MG tablet Take 1 tablet by mouth every 6 (six) hours as needed for moderate pain. 08/31/15   Charm RingsHonig, Erin J, MD  lisinopril (PRINIVIL,ZESTRIL) 5 MG tablet Take 1 tablet (5 mg total) by mouth daily. NEED  TO MAKE APPT  FOR FURTHER REFILLS 02/18/16   Lars MassonNelson, Katarina H, MD  predniSONE (DELTASONE) 50 MG tablet Take 1 pill daily for 5 days. 08/31/15   Charm RingsHonig, Erin J, MD    Family History Family History  Problem Relation Age of Onset  . Hypertension Mother   . Stroke Maternal Grandmother   . Hypertension Maternal Grandmother   . Heart failure Maternal Grandfather   . Hypertension Maternal Grandfather     Social History Social History   Tobacco Use  . Smoking status: Former Smoker    Last attempt to quit: 03/05/2014    Years since quitting: 3.8  . Smokeless tobacco: Never Used  Substance Use Topics  . Alcohol use: Yes    Comment: social  . Drug use: Yes    Frequency: 7.0 times per week    Types: Marijuana     Allergies   Patient has no known allergies.   Review of  Systems Review of Systems  Constitutional: Negative for chills and fever.  HENT: Negative for ear pain and sore throat.   Eyes: Negative for pain and visual disturbance.  Respiratory: Negative for cough and shortness of breath.   Cardiovascular: Negative for chest pain and palpitations.  Gastrointestinal: Negative for abdominal pain and vomiting.  Genitourinary: Negative for dysuria and hematuria.  Musculoskeletal: Negative for arthralgias and back pain.  Skin: Negative for color change and rash.  Neurological: Negative for seizures and syncope.  All other systems reviewed and are negative.    Physical Exam Triage Vital Signs ED Triage Vitals [01/19/18 1732]  Enc Vitals Group     BP (!) 133/93     Pulse Rate (!) 106     Resp 18     Temp 98.3 F (36.8 C)     Temp  Source Oral     SpO2 96 %     Weight      Height      Head Circumference      Peak Flow      Pain Score 9     Pain Loc      Pain Edu?      Excl. in GC?    No data found.  Updated Vital Signs BP (!) 133/93 (BP Location: Left Arm)   Pulse (!) 106   Temp 98.3 F (36.8 C) (Oral)   Resp 18   SpO2 96%   Visual Acuity Right Eye Distance:   Left Eye Distance:   Bilateral Distance:    Right Eye Near:   Left Eye Near:    Bilateral Near:     Physical Exam Constitutional:      Appearance: Normal appearance.  HENT:     Head: Normocephalic and atraumatic.     Nose: Nose normal.  Eyes:     Conjunctiva/sclera: Conjunctivae normal.  Pulmonary:     Effort: Pulmonary effort is normal. No respiratory distress.  Musculoskeletal:     Comments: Left ankle is normal to inspection without deformity.  There is moderate swelling diffusely.  Patient is able to partially bear weight.  There is mild tenderness to palpation over the lateral malleolus and distal fibula.  No tenderness to palpation over the fifth metatarsal.  No medial ankle tenderness.  No joint line tenderness.  No proximal ankle tenderness.  Range of motion is normal.  Strength is normal.  Anterior drawer testing is negative.  Skin:    General: Skin is warm and dry.     Capillary Refill: Capillary refill takes less than 2 seconds.  Neurological:     General: No focal deficit present.     Mental Status: He is alert and oriented to person, place, and time.  Psychiatric:        Mood and Affect: Mood normal.        Behavior: Behavior normal.      UC Treatments / Results  Labs (all labs ordered are listed, but only abnormal results are displayed) Labs Reviewed - No data to display  EKG None  Radiology Dg Ankle Complete Left  Result Date: 01/19/2018 CLINICAL DATA:  Sprain with lateral pain EXAM: LEFT ANKLE COMPLETE - 3+ VIEW COMPARISON:  None. FINDINGS: Lateral soft tissue swelling. No acute displaced fracture or  malalignment. The ankle mortise is symmetric. IMPRESSION: Lateral soft tissue swelling.  No acute osseous abnormality. Electronically Signed   By: Jasmine Pang M.D.   On: 01/19/2018 18:21    Procedures Procedures (including critical care  time)  Medications Ordered in UC Medications - No data to display  Initial Impression / Assessment and Plan / UC Course  I have reviewed the triage vital signs and the nursing notes.  Pertinent labs & imaging results that were available during my care of the patient were reviewed by me and considered in my medical decision making (see chart for details).    He has sustained an ankle sprain.  He was given instructions about symptomatic treatment and return precautions.   Final Clinical Impressions(s) / UC Diagnoses   Final diagnoses:  Sprain of anterior talofibular ligament of left ankle, initial encounter   Discharge Instructions   None    ED Prescriptions    None     Controlled Substance Prescriptions [place] Controlled Substance Registry consulted? No   Marshell Levan, MD 01/19/18 [phone]

## 2020-12-23 ENCOUNTER — Other Ambulatory Visit: Payer: Self-pay

## 2020-12-23 ENCOUNTER — Encounter (HOSPITAL_COMMUNITY): Payer: Self-pay | Admitting: Emergency Medicine

## 2020-12-23 ENCOUNTER — Ambulatory Visit (HOSPITAL_COMMUNITY)
Admission: EM | Admit: 2020-12-23 | Discharge: 2020-12-23 | Disposition: A | Discharge status: Home / Self Care | Payer: Self-pay | Attending: Urgent Care | Admitting: Urgent Care

## 2020-12-23 DIAGNOSIS — Z8739 Personal history of other diseases of the musculoskeletal system and connective tissue: Secondary | ICD-10-CM

## 2020-12-23 DIAGNOSIS — M5442 Lumbago with sciatica, left side: Secondary | ICD-10-CM

## 2020-12-23 MED ORDER — NAPROXEN 375 MG PO TABS: 375.0000 mg | ORAL_TABLET | Freq: Two times a day (BID) | ORAL | 0 refills | Status: DC

## 2020-12-23 MED ORDER — TIZANIDINE HCL 4 MG PO TABS: 4.0000 mg | ORAL_TABLET | Freq: Every day | ORAL | 0 refills | Status: DC

## 2020-12-23 MED ORDER — PREDNISONE 50 MG PO TABS: 50.0000 mg | ORAL_TABLET | Freq: Every day | ORAL | 0 refills | Status: DC

## 2020-12-23 NOTE — ED Provider Notes (Signed)
[facility]   MRN: [id number] DOB: May 18, 1980  Subjective:   Kevin Romero is a 40 y.o. male presenting for several day history of acute onset recurrent left-sided low back pain that radiates into the left leg all the way to the toes.  Has a history of a herniated disc.  Was previously recommended to have surgery but he did not want to pursue this then.  Has not followed up with a back specialist.  No recent fall, trauma, weakness, saddle paresthesia, changes to bowel or urinary habits.  Has previously gotten hydrocodone for this.   No current facility-administered medications for this encounter.  Current Outpatient Medications:    colchicine 0.6 MG tablet, TAKE 1 TABLET BY MOUTH 2 TIMES DAILY EVERY 12 HOURS (Patient not taking: Reported on 12/23/2020), Disp: 60 tablet, Rfl: 3   cyclobenzaprine (FLEXERIL) 10 MG tablet, Take 1 tablet (10 mg total) by mouth 3 (three) times daily as needed for muscle spasms. (Patient not taking: Reported on 12/23/2020), Disp: 30 tablet, Rfl: 0   HYDROcodone-acetaminophen (NORCO) 5-325 MG tablet, Take 1 tablet by mouth every 6 (six) hours as needed for moderate pain. (Patient not taking: Reported on 12/23/2020), Disp: 15 tablet, Rfl: 0   lisinopril (PRINIVIL,ZESTRIL) 5 MG tablet, Take 1 tablet (5 mg total) by mouth daily. NEED  TO MAKE APPT  FOR FURTHER REFILLS (Patient not taking: Reported on 12/23/2020), Disp: 90 tablet, Rfl: 0   predniSONE (DELTASONE) 50 MG tablet, Take 1 pill daily for 5 days., Disp: 5 tablet, Rfl: 0   No Known Allergies  Past Medical History:  Diagnosis Date   Acute myopericarditis 06/25/2014   confirmed by MRI, negative cath 06/24/2014   Back pain    Hyperlipidemia    Hypertension      Past Surgical History:  Procedure Laterality Date   CARDIAC CATHETERIZATION N/A 06/24/2014   Procedure: LEFT HEART CATH AND CORS;  Surgeon: Peter M Swaziland, MD;  Location: [facility] INVASIVE CV LAB;  Service: Cardiovascular;   Laterality: N/A;    Family History  Problem Relation Age of Onset   Hypertension Mother    Stroke Maternal Grandmother    Hypertension Maternal Grandmother    Heart failure Maternal Grandfather    Hypertension Maternal Grandfather     Social History   Tobacco Use   Smoking status: Former    Types: Cigarettes    Quit date: 03/05/2014    Years since quitting: 6.8   Smokeless tobacco: Never  Vaping Use   Vaping Use: Never used  Substance Use Topics   Alcohol use: Yes    Comment: social   Drug use: Yes    Frequency: 7.0 times per week    Types: Marijuana    ROS   Objective:   Vitals: BP (!) 166/74 (BP Location: Left Arm)   Pulse 75   Temp 98.3 F (36.8 C) (Oral)   Resp 18   SpO2 98%   Physical Exam Constitutional:      General: He is not in acute distress.    Appearance: Normal appearance. He is well-developed and normal weight. He is not ill-appearing, toxic-appearing or diaphoretic.  HENT:     Head: Normocephalic and atraumatic.     Right Ear: External ear normal.     Left Ear: External ear normal.     Nose: Nose normal.     Mouth/Throat:     Pharynx: Oropharynx is clear.  Eyes:     General: No scleral icterus.  Right eye: No discharge.        Left eye: No discharge.     Extraocular Movements: Extraocular movements intact.     Pupils: Pupils are equal, round, and reactive to light.  Cardiovascular:     Rate and Rhythm: Normal rate.  Pulmonary:     Effort: Pulmonary effort is normal.  Musculoskeletal:     Cervical back: Normal range of motion.     Comments: Full range of motion throughout.  Strength 5/5 for lower extremities.  Patient ambulates without any assistance at expected pace.  No ecchymosis, swelling, lacerations or abrasions.  Patient does have paraspinal muscle tenderness along the lumbar region of his back excluding the midline.  Negative straight leg raise bilaterally.  Neurological:     Mental Status: He is alert and oriented to  person, place, and time.  Psychiatric:        Mood and Affect: Mood normal.        Behavior: Behavior normal.        Thought Content: Thought content normal.        Judgment: Judgment normal.     Assessment and Plan :   PDMP not reviewed this encounter.  1. Acute left-sided low back pain with left-sided sciatica   2. History of herniated intervertebral disc    Given severity of his pain, recommended a oral prednisone course.  Switch to naproxen thereafter.  Use tizanidine as needed.  Recommended follow-up with [facility].  Deferred imaging as it does not help [place] with herniated disc, sciatica.  Counseled patient on potential for adverse effects with medications prescribed/recommended today, ER and return-to-clinic precautions discussed, patient verbalized understanding.    [name], [place] 12/23/20 [id number]

## 2020-12-23 NOTE — Discharge Instructions (Addendum)
Start prednisone for your current sciatica flare. When you are done with this then switch to naproxen thereafter if you have pain. Use the muscle relaxant as needed. Follow up with the neurospine specialist.
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
## 2021-05-31 ENCOUNTER — Ambulatory Visit (HOSPITAL_COMMUNITY)
Admission: EM | Admit: 2021-05-31 | Discharge: 2021-05-31 | Disposition: A | Discharge status: Home / Self Care | Payer: Self-pay | Attending: Physician Assistant | Admitting: Physician Assistant

## 2021-05-31 ENCOUNTER — Ambulatory Visit (INDEPENDENT_AMBULATORY_CARE_PROVIDER_SITE_OTHER): Payer: Self-pay

## 2021-05-31 ENCOUNTER — Encounter (HOSPITAL_COMMUNITY): Payer: Self-pay | Admitting: Emergency Medicine

## 2021-05-31 DIAGNOSIS — M25561 Pain in right knee: Secondary | ICD-10-CM

## 2021-05-31 DIAGNOSIS — M25511 Pain in right shoulder: Secondary | ICD-10-CM

## 2021-05-31 DIAGNOSIS — M62838 Other muscle spasm: Secondary | ICD-10-CM

## 2021-05-31 MED ORDER — CYCLOBENZAPRINE HCL 10 MG PO TABS: 10.0000 mg | ORAL_TABLET | Freq: Three times a day (TID) | ORAL | 0 refills | Status: DC | PRN
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
## 2021-05-31 NOTE — Discharge Instructions (Addendum)
Recommend Ibuprofen as needed ?Take Flexeril as needed for muscle spasm ?Apply ice to affected areas.  ?Recommend light stretching ?If no improvement follow up with primary care or orthopedics.  ?

## 2021-06-07 ENCOUNTER — Emergency Department (HOSPITAL_COMMUNITY): Payer: No Typology Code available for payment source

## 2021-06-07 ENCOUNTER — Emergency Department (HOSPITAL_COMMUNITY)
Admission: EM | Admit: 2021-06-07 | Discharge: 2021-06-08 | Disposition: A | Discharge status: Home / Self Care | Payer: No Typology Code available for payment source | Attending: Emergency Medicine | Admitting: Emergency Medicine

## 2021-06-07 DIAGNOSIS — M5432 Sciatica, left side: Secondary | ICD-10-CM | POA: Diagnosis not present

## 2021-06-07 DIAGNOSIS — Z5321 Procedure and treatment not carried out due to patient leaving prior to being seen by health care provider: Secondary | ICD-10-CM | POA: Diagnosis not present

## 2021-06-07 DIAGNOSIS — M5431 Sciatica, right side: Secondary | ICD-10-CM | POA: Insufficient documentation

## 2021-06-07 DIAGNOSIS — M549 Dorsalgia, unspecified: Secondary | ICD-10-CM | POA: Diagnosis present

## 2021-06-07 DIAGNOSIS — M542 Cervicalgia: Secondary | ICD-10-CM | POA: Diagnosis not present

## 2021-06-07 DIAGNOSIS — Y92410 Unspecified street and highway as the place of occurrence of the external cause: Secondary | ICD-10-CM | POA: Insufficient documentation

## 2021-06-07 MED ORDER — OXYCODONE-ACETAMINOPHEN 5-325 MG PO TABS
1.0000 | ORAL_TABLET | Freq: Once | ORAL | Status: AC
  Administered 2021-06-07: 1 via ORAL
  Filled 2021-06-07: qty 1
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
## 2021-06-08 ENCOUNTER — Encounter (HOSPITAL_COMMUNITY): Payer: Self-pay

## 2021-06-08 ENCOUNTER — Ambulatory Visit (HOSPITAL_COMMUNITY)
Admission: EM | Admit: 2021-06-08 | Discharge: 2021-06-08 | Disposition: A | Discharge status: Home / Self Care | Payer: Self-pay | Attending: Nurse Practitioner | Admitting: Nurse Practitioner

## 2021-06-08 DIAGNOSIS — M5442 Lumbago with sciatica, left side: Secondary | ICD-10-CM

## 2021-06-08 DIAGNOSIS — G8929 Other chronic pain: Secondary | ICD-10-CM

## 2021-06-08 DIAGNOSIS — M5441 Lumbago with sciatica, right side: Secondary | ICD-10-CM

## 2021-06-08 DIAGNOSIS — M542 Cervicalgia: Secondary | ICD-10-CM

## 2021-06-08 MED ORDER — PREDNISONE 50 MG PO TABS: 50.0000 mg | ORAL_TABLET | Freq: Every day | ORAL | 0 refills | Status: DC

## 2021-06-08 MED ORDER — NAPROXEN 375 MG PO TABS: 375.0000 mg | ORAL_TABLET | Freq: Two times a day (BID) | ORAL | 0 refills | Status: DC
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
## 2021-06-08 NOTE — ED Notes (Addendum)
Pt states he does not want to wait anymore, asking why he is unable to be moved to a bed to have results read. Pt advised by this RN that pts go back to beds/rooms based on acuity & condition, & that no beds/providers available for pt to be seen at present. Pt requesting names of charge nurse as well as other [place] facilities. All the above provided, pt ambulatory out of triage/ED. ?

## 2021-06-08 NOTE — Discharge Instructions (Addendum)
-   Please start naproxen twice daily with food for your pain ?-Please also start prednisone 50 mg daily with breakfast to help with inflammation in your spine ?-Schedule an appointment with neurosurgery as soon as you are able ?- We will reach out to you to help schedule an appointment with a primary care provider ?

## 2021-06-15 ENCOUNTER — Encounter (HOSPITAL_COMMUNITY): Payer: Self-pay | Admitting: Emergency Medicine

## 2021-06-15 ENCOUNTER — Ambulatory Visit (HOSPITAL_COMMUNITY)
Admission: EM | Admit: 2021-06-15 | Discharge: 2021-06-15 | Disposition: A | Discharge status: Home / Self Care | Payer: Self-pay

## 2021-06-15 DIAGNOSIS — M542 Cervicalgia: Secondary | ICD-10-CM

## 2021-06-15 NOTE — ED Triage Notes (Signed)
Pt reports in [facility] 4/22 and seen here couple times for pains. Reports tingling in right arm and hand still persistent and not getting any better with medications prescribed.  ?
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
## 2021-06-15 NOTE — Discharge Instructions (Signed)
Please go to [facility] tomorrow for evaluation by a specialist as your symptoms have continued to persist past use of anti-inflammatory medicine, steroids and muscle relaxants with no improvement ?

## 2022-06-22 ENCOUNTER — Ambulatory Visit (HOSPITAL_COMMUNITY)
Admission: EM | Admit: 2022-06-22 | Discharge: 2022-06-22 | Disposition: A | Discharge status: Home / Self Care | Payer: BLUE CROSS/BLUE SHIELD | Attending: Internal Medicine | Admitting: Internal Medicine

## 2022-06-22 ENCOUNTER — Encounter (HOSPITAL_COMMUNITY): Payer: Self-pay | Admitting: *Deleted

## 2022-06-22 ENCOUNTER — Other Ambulatory Visit: Payer: Self-pay

## 2022-06-22 DIAGNOSIS — G8929 Other chronic pain: Secondary | ICD-10-CM | POA: Diagnosis not present

## 2022-06-22 DIAGNOSIS — M542 Cervicalgia: Secondary | ICD-10-CM

## 2022-06-22 DIAGNOSIS — J301 Allergic rhinitis due to pollen: Secondary | ICD-10-CM | POA: Diagnosis not present

## 2022-06-22 MED ORDER — BENZONATATE 100 MG PO CAPS: 100.0000 mg | ORAL_CAPSULE | Freq: Three times a day (TID) | ORAL | 0 refills | Status: DC

## 2022-06-22 MED ORDER — METHOCARBAMOL 500 MG PO TABS: 500.0000 mg | ORAL_TABLET | Freq: Every evening | ORAL | 0 refills | Status: DC | PRN
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
## 2022-06-22 NOTE — Discharge Instructions (Addendum)
Your symptoms are due to pollen allergy.  Take Claritin at bedtime.  You may purchase this over-the-counter.  This will help to dry up your nasal drainage contributing to your cough.  Take Tessalon Perles every 8 hours as needed for cough.  You may take ibuprofen 600 mg every 6 hours as needed for pain and inflammation to the spine. You may take Robaxin muscle relaxer at bedtime as needed for muscle spasm to the neck. You may find that applying heat to the neck helps with your symptoms and relax his muscles. Schedule an appointment with [facility] for follow-up evaluation.  Return to urgent care as needed.
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
## 2023-11-03 ENCOUNTER — Encounter (HOSPITAL_COMMUNITY): Payer: Self-pay

## 2023-11-03 ENCOUNTER — Ambulatory Visit (HOSPITAL_COMMUNITY)
Admission: EM | Admit: 2023-11-03 | Discharge: 2023-11-03 | Disposition: A | Discharge status: Home / Self Care | Payer: Self-pay | Attending: Emergency Medicine | Admitting: Emergency Medicine

## 2023-11-03 DIAGNOSIS — L02212 Cutaneous abscess of back [any part, except buttock]: Secondary | ICD-10-CM

## 2023-11-03 MED ORDER — LIDOCAINE HCL (PF) 1 % IJ SOLN
INTRAMUSCULAR | Status: AC
  Filled 2023-11-03: qty 2

## 2023-11-03 MED ORDER — CEFTRIAXONE SODIUM 1 G IJ SOLR
INTRAMUSCULAR | Status: AC
  Filled 2023-11-03: qty 10

## 2023-11-03 MED ORDER — CEFTRIAXONE SODIUM 1 G IJ SOLR
1.0000 g | Freq: Once | INTRAMUSCULAR | Status: AC
  Administered 2023-11-03: 1 g via INTRAMUSCULAR

## 2023-11-03 MED ORDER — DOXYCYCLINE HYCLATE 100 MG PO CAPS: 100.0000 mg | ORAL_CAPSULE | Freq: Two times a day (BID) | ORAL | 0 refills | Status: AC

## 2023-11-03 MED ORDER — CEFTRIAXONE SODIUM 500 MG IJ SOLR
INTRAMUSCULAR | Status: AC
  Filled 2023-11-03: qty 500

## 2023-11-03 NOTE — ED Provider Notes (Addendum)
 [facility]    CSN: [id number] Arrival date & time: 11/03/23  1521      History   Chief Complaint Chief Complaint  Patient presents with   Abscess    HPI Kevin Romero is a 43 y.o. male.   Patient presents with painful lump to his mid back that he noticed 4 days ago.  Patient states that he has had a small painless lump to this area for about 5 or 6 years, but then states that he was sitting back in a chair and noticed that it was tender on 9/22.  Patient states since then the area has become larger and more painful.  Patient denies any drainage from this area.  Patient also denies fever, body aches, chills, and weakness.  The history is provided by the patient and medical records.  Abscess   Past Medical History:  Diagnosis Date   Acute myopericarditis 06/25/2014   confirmed by MRI, negative cath 06/24/2014   Back pain    Hyperlipidemia    Hypertension     Patient Active Problem List   Diagnosis Date Noted   Acute myopericarditis 06/25/2014   Elevated troponin 06/25/2014   Hyperlipidemia 06/25/2014   Essential hypertension 06/23/2014   Chest pain 06/23/2014    Past Surgical History:  Procedure Laterality Date   CARDIAC CATHETERIZATION N/A 06/24/2014   Procedure: LEFT HEART CATH AND CORS;  Surgeon: Peter M Swaziland, MD;  Location: [facility] INVASIVE CV LAB;  Service: Cardiovascular;  Laterality: N/A;       Home Medications    Prior to Admission medications   Medication Sig Start Date End Date Taking? Authorizing Provider  doxycycline  (VIBRAMYCIN ) 100 MG capsule Take 1 capsule (100 mg total) by mouth 2 (two) times daily. 11/03/23  Yes Johnie Rumaldo LABOR, NP    Family History Family History  Problem Relation Age of Onset   Hypertension Mother    Stroke Maternal Grandmother    Hypertension Maternal Grandmother    Heart failure Maternal Grandfather    Hypertension Maternal Grandfather     Social History Social History   Tobacco Use   Smoking  status: Former    Current packs/day: 0.00    Types: Cigarettes    Quit date: 03/05/2014    Years since quitting: 9.6   Smokeless tobacco: Never  Vaping Use   Vaping status: Never Used  Substance Use Topics   Alcohol use: Yes    Comment: social   Drug use: Yes    Frequency: 7.0 times per week    Types: Marijuana     Allergies   Patient has no known allergies.   Review of Systems Review of Systems  Per HPI  Physical Exam Triage Vital Signs ED Triage Vitals [11/03/23 1549]  Encounter Vitals Group     BP 123/79     Girls Systolic BP Percentile      Girls Diastolic BP Percentile      Boys Systolic BP Percentile      Boys Diastolic BP Percentile      Pulse Rate (!) 2     Resp 14     Temp 98.5 F (36.9 C)     Temp Source Oral     SpO2 96 %     Weight      Height      Head Circumference      Peak Flow      Pain Score      Pain Loc  Pain Education      Exclude from Growth Chart    No data found.  Updated Vital Signs BP 123/79 (BP Location: Right Arm)   Pulse 90   Temp 98.5 F (36.9 C) (Oral)   Resp 14   SpO2 96%   Visual Acuity Right Eye Distance:   Left Eye Distance:   Bilateral Distance:    Right Eye Near:   Left Eye Near:    Bilateral Near:     Physical Exam Vitals and nursing note reviewed.  Constitutional:      General: He is awake. He is not in acute distress.    Appearance: Normal appearance. He is well-developed and well-groomed. He is not ill-appearing.  Skin:    General: Skin is warm and dry.     Findings: Abscess and erythema present.         Comments: Approximately 5 cm x 6 cm area of induration noted to left mid back.  This area is erythematous and tender.  No drainage noted from the area.  Neurological:     Mental Status: He is alert.  Psychiatric:        Behavior: Behavior is cooperative.      UC Treatments / Results  Labs (all labs ordered are listed, but only abnormal results are displayed) Labs Reviewed - No data to  display  EKG   Radiology No results found.  Procedures Procedures (including critical care time)  Medications Ordered in UC Medications  cefTRIAXone  (ROCEPHIN ) injection 1 g (has no administration in time range)    Initial Impression / Assessment and Plan / UC Course  I have reviewed the triage vital signs and the nursing notes.  Pertinent labs & imaging results that were available during my care of the patient were reviewed by me and considered in my medical decision making (see chart for details).     Patient is overall well-appearing.  Vitals are stable.  Deferred I&D due to lack of fluctuance.  Given IM Rocephin  in clinic and prescribed doxycycline  for abscess coverage.  Given ambulatory referral to dermatology. Discussed follow-up, return, and strict ER precautions. Final Clinical Impressions(s) / UC Diagnoses   Final diagnoses:  Abscess of back     Discharge Instructions      Start taking doxycycline  twice daily for 10 days for abscess coverage. Apply warm compresses to this area to help promote drainage and decrease swelling. Alternate between 500 mg of Tylenol  and 400 to 600 mg of ibuprofen  every 6-8 hours as needed for pain. I provided you with an ambulatory referral to dermatology that you can follow-up with if you have ongoing issues with this area.  If you do not hear from them within the week you can call to schedule an appointment with them. If you develop increased swelling, spreading of redness, fever, body aches, chills, or weakness please seek immediate medical treatment in the emergency department. Otherwise follow-up with your primary care provider or return here as needed.    ED Prescriptions     Medication Sig Dispense Auth. Provider   doxycycline  (VIBRAMYCIN ) 100 MG capsule Take 1 capsule (100 mg total) by mouth 2 (two) times daily. 20 capsule Johnie Flaming A, NP      PDMP not reviewed this encounter.   Johnie Flaming LABOR, NP 11/03/23  1608    Johnie Flaming A, NP 11/03/23 [phone]

## 2023-11-03 NOTE — ED Triage Notes (Signed)
 Patient has a large abscess to the mid back x 3 days.  Patient states he has been using hot compresses three times a day.

## 2023-11-03 NOTE — Discharge Instructions (Signed)
 Start taking doxycycline  twice daily for 10 days for abscess coverage. Apply warm compresses to this area to help promote drainage and decrease swelling. Alternate between 500 mg of Tylenol  and 400 to 600 mg of ibuprofen  every 6-8 hours as needed for pain. I provided you with an ambulatory referral to dermatology that you can follow-up with if you have ongoing issues with this area.  If you do not hear from them within the week you can call to schedule an appointment with them. If you develop increased swelling, spreading of redness, fever, body aches, chills, or weakness please seek immediate medical treatment in the emergency department. Otherwise follow-up with your primary care provider or return here as needed.
